# Patient Record
Sex: Male | Born: 1985
Health system: Southern US, Community
[De-identification: ages and names within clinical notes are randomized; demographics above are authoritative.]

## PROBLEM LIST (undated history)

## (undated) DIAGNOSIS — S0101XA Laceration without foreign body of scalp, initial encounter: Secondary | ICD-10-CM

## (undated) DIAGNOSIS — Z8719 Personal history of other diseases of the digestive system: Secondary | ICD-10-CM

## (undated) DIAGNOSIS — K759 Inflammatory liver disease, unspecified: Secondary | ICD-10-CM

## (undated) DIAGNOSIS — T07XXXA Unspecified multiple injuries, initial encounter: Secondary | ICD-10-CM

## (undated) DIAGNOSIS — T4145XA Adverse effect of unspecified anesthetic, initial encounter: Secondary | ICD-10-CM

## (undated) DIAGNOSIS — Z87820 Personal history of traumatic brain injury: Secondary | ICD-10-CM

## (undated) DIAGNOSIS — F988 Other specified behavioral and emotional disorders with onset usually occurring in childhood and adolescence: Secondary | ICD-10-CM

## (undated) DIAGNOSIS — T8859XA Other complications of anesthesia, initial encounter: Secondary | ICD-10-CM

## (undated) DIAGNOSIS — M1990 Unspecified osteoarthritis, unspecified site: Secondary | ICD-10-CM

## (undated) DIAGNOSIS — S022XXA Fracture of nasal bones, initial encounter for closed fracture: Secondary | ICD-10-CM

## (undated) HISTORY — PX: DENTAL SURGERY: SHX609

## (undated) HISTORY — PX: LIVER BIOPSY: SHX301

---
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
## 2006-02-19 ENCOUNTER — Emergency Department (HOSPITAL_COMMUNITY): Admission: EM | Admit: 2006-02-19 | Discharge: 2006-02-19 | Payer: Self-pay | Admitting: Family Medicine

## 2007-08-27 ENCOUNTER — Emergency Department (HOSPITAL_COMMUNITY): Admission: EM | Admit: 2007-08-27 | Discharge: 2007-08-27 | Payer: Self-pay | Admitting: Emergency Medicine

## 2008-11-20 ENCOUNTER — Emergency Department (HOSPITAL_COMMUNITY): Admission: EM | Admit: 2008-11-20 | Discharge: 2008-11-20 | Payer: Self-pay | Admitting: Emergency Medicine

## 2009-02-02 ENCOUNTER — Emergency Department (HOSPITAL_COMMUNITY): Admission: EM | Admit: 2009-02-02 | Discharge: 2009-02-02 | Payer: Self-pay | Admitting: Family Medicine

## 2010-01-12 ENCOUNTER — Emergency Department (HOSPITAL_COMMUNITY): Admission: EM | Admit: 2010-01-12 | Discharge: 2010-01-12 | Payer: Self-pay | Admitting: Emergency Medicine

## 2010-01-17 ENCOUNTER — Ambulatory Visit (HOSPITAL_BASED_OUTPATIENT_CLINIC_OR_DEPARTMENT_OTHER): Admission: RE | Admit: 2010-01-17 | Discharge: 2010-01-17 | Payer: Self-pay | Admitting: Orthopedic Surgery

## 2010-01-17 HISTORY — PX: FINGER SURGERY: SHX640

## 2010-04-25 ENCOUNTER — Emergency Department (HOSPITAL_COMMUNITY): Admission: EM | Admit: 2010-04-25 | Discharge: 2010-04-25 | Payer: Self-pay | Admitting: Emergency Medicine

## 2011-01-28 LAB — HEPATIC FUNCTION PANEL
Albumin: 4.4 g/dL (ref 3.5–5.2)
Alkaline Phosphatase: 49 U/L (ref 39–117)
Bilirubin, Direct: <0.1 mg/dL (ref 0.0–0.3)
Total Bilirubin: 0.7 mg/dL (ref 0.3–1.2)

## 2011-01-28 LAB — BASIC METABOLIC PANEL
CO2: 30 mEq/L (ref 19–32)
Calcium: 9.1 mg/dL (ref 8.4–10.5)
Chloride: 103 mEq/L (ref 96–112)
Creatinine, Ser: 0.79 mg/dL (ref 0.4–1.5)
GFR calc Af Amer: >60 mL/min (ref >60)
Glucose, Bld: 91 mg/dL (ref 70–99)

## 2011-01-28 LAB — POCT HEMOGLOBIN-HEMACUE: Hemoglobin: 17.1 g/dL — ABNORMAL HIGH (ref 13.0–17.0)

## 2012-03-09 ENCOUNTER — Emergency Department (HOSPITAL_COMMUNITY): Payer: No Typology Code available for payment source

## 2012-03-09 ENCOUNTER — Emergency Department (HOSPITAL_COMMUNITY)
Admission: EM | Admit: 2012-03-09 | Discharge: 2012-03-09 | Disposition: A | Discharge status: Home / Self Care | Payer: No Typology Code available for payment source | Attending: Emergency Medicine | Admitting: Emergency Medicine

## 2012-03-09 ENCOUNTER — Encounter (HOSPITAL_COMMUNITY): Payer: Self-pay

## 2012-03-09 DIAGNOSIS — R1012 Left upper quadrant pain: Secondary | ICD-10-CM | POA: Insufficient documentation

## 2012-03-09 DIAGNOSIS — T07XXXA Unspecified multiple injuries, initial encounter: Secondary | ICD-10-CM

## 2012-03-09 DIAGNOSIS — R51 Headache: Secondary | ICD-10-CM | POA: Insufficient documentation

## 2012-03-09 DIAGNOSIS — S022XXA Fracture of nasal bones, initial encounter for closed fracture: Secondary | ICD-10-CM

## 2012-03-09 DIAGNOSIS — S0101XA Laceration without foreign body of scalp, initial encounter: Secondary | ICD-10-CM

## 2012-03-09 DIAGNOSIS — R079 Chest pain, unspecified: Secondary | ICD-10-CM | POA: Insufficient documentation

## 2012-03-09 DIAGNOSIS — R040 Epistaxis: Secondary | ICD-10-CM | POA: Insufficient documentation

## 2012-03-09 DIAGNOSIS — S0100XA Unspecified open wound of scalp, initial encounter: Secondary | ICD-10-CM | POA: Insufficient documentation

## 2012-03-09 HISTORY — DX: Fracture of nasal bones, initial encounter for closed fracture: S02.2XXA

## 2012-03-09 HISTORY — DX: Unspecified multiple injuries, initial encounter: T07.XXXA

## 2012-03-09 HISTORY — DX: Laceration without foreign body of scalp, initial encounter: S01.01XA

## 2012-03-09 LAB — DIFFERENTIAL
Eosinophils Absolute: 0.1 10*3/uL (ref 0.0–0.7)
Lymphocytes Relative: 34 % (ref 12–46)
Lymphs Abs: 3 10*3/uL (ref 0.7–4.0)
Monocytes Relative: 4 % (ref 3–12)
Neutro Abs: 5.3 10*3/uL (ref 1.7–7.7)
Neutrophils Relative %: 61 % (ref 43–77)

## 2012-03-09 LAB — BASIC METABOLIC PANEL
BUN: 14 mg/dL (ref 6–23)
CO2: 23 mEq/L (ref 19–32)
Chloride: 104 mEq/L (ref 96–112)
Glucose, Bld: 117 mg/dL — ABNORMAL HIGH (ref 70–99)
Potassium: 3.4 mEq/L — ABNORMAL LOW (ref 3.5–5.1)

## 2012-03-09 LAB — CBC
Hemoglobin: 16.4 g/dL (ref 13.0–17.0)
MCH: 29.8 pg (ref 26.0–34.0)

## 2012-03-09 MED ORDER — FENTANYL CITRATE 0.05 MG/ML IJ SOLN
100.0000 ug | INTRAMUSCULAR | Status: DC | PRN
  Filled 2012-03-09: qty 2

## 2012-03-09 MED ORDER — OXYCODONE-ACETAMINOPHEN 5-325 MG PO TABS
1.0000 | ORAL_TABLET | Freq: Once | ORAL | Status: AC
  Administered 2012-03-09: 1 via ORAL

## 2012-03-09 MED ORDER — TETANUS-DIPHTH-ACELL PERTUSSIS 5-2.5-18.5 LF-MCG/0.5 IM SUSP
0.5000 mL | Freq: Once | INTRAMUSCULAR | Status: AC
  Administered 2012-03-09: 0.5 mL via INTRAMUSCULAR
  Filled 2012-03-09: qty 0.5

## 2012-03-09 MED ORDER — MORPHINE SULFATE 4 MG/ML IJ SOLN
4.0000 mg | Freq: Once | INTRAMUSCULAR | Status: AC
  Administered 2012-03-09: 4 mg via INTRAVENOUS
  Filled 2012-03-09: qty 1

## 2012-03-09 MED ORDER — FENTANYL CITRATE 0.05 MG/ML IJ SOLN
100.0000 ug | Freq: Once | INTRAMUSCULAR | Status: AC
  Administered 2012-03-09: 100 ug via INTRAVENOUS

## 2012-03-09 MED ORDER — IOHEXOL 300 MG/ML  SOLN
100.0000 mL | Freq: Once | INTRAMUSCULAR | Status: AC | PRN
  Administered 2012-03-09: 100 mL via INTRAVENOUS

## 2012-03-09 MED ORDER — OXYCODONE-ACETAMINOPHEN 5-325 MG PO TABS
ORAL_TABLET | ORAL | Status: AC
  Filled 2012-03-09: qty 1

## 2012-03-09 MED ORDER — FENTANYL CITRATE 0.05 MG/ML IJ SOLN
INTRAMUSCULAR | Status: AC
  Administered 2012-03-09: 100 ug
  Filled 2012-03-09: qty 2

## 2012-03-09 MED ORDER — LIDOCAINE-EPINEPHRINE-TETRACAINE (LET) SOLUTION
3.0000 mL | Freq: Once | NASAL | Status: AC
  Administered 2012-03-09: 3 mL via TOPICAL
  Filled 2012-03-09: qty 3

## 2012-03-09 MED ORDER — SODIUM CHLORIDE 0.9 % IV BOLUS (SEPSIS)
1000.0000 mL | Freq: Once | INTRAVENOUS | Status: AC
  Administered 2012-03-09: 1000 mL via INTRAVENOUS
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
## 2012-03-09 NOTE — ED Notes (Signed)
Pt returned from radiology, given suction for oral secretions. Family now at bedside

## 2012-03-09 NOTE — ED Notes (Signed)
Wet saline gauze placed over face to assist with cleaning
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
## 2012-03-09 NOTE — ED Notes (Signed)
Pt ambulated to restroom. 

## 2012-03-09 NOTE — ED Notes (Signed)
See trauma narrator 

## 2012-03-09 NOTE — ED Notes (Signed)
Pt cleaned of some dried blood, will give rest due to pain and attempt more

## 2012-03-11 ENCOUNTER — Encounter (HOSPITAL_BASED_OUTPATIENT_CLINIC_OR_DEPARTMENT_OTHER): Payer: Self-pay | Admitting: *Deleted

## 2012-03-11 NOTE — Pre-Procedure Instructions (Signed)
To come for CMET 

## 2012-03-12 ENCOUNTER — Encounter (HOSPITAL_BASED_OUTPATIENT_CLINIC_OR_DEPARTMENT_OTHER)
Admission: RE | Admit: 2012-03-12 | Discharge: 2012-03-12 | Disposition: A | Discharge status: Home / Self Care | Payer: No Typology Code available for payment source | Source: Ambulatory Visit | Attending: Otolaryngology | Admitting: Otolaryngology

## 2012-03-12 LAB — COMPREHENSIVE METABOLIC PANEL
ALT: 37 U/L (ref 0–53)
Alkaline Phosphatase: 43 U/L (ref 39–117)
CO2: 27 mEq/L (ref 19–32)
Calcium: 9.4 mg/dL (ref 8.4–10.5)
GFR calc Af Amer: >90 mL/min (ref >90)
GFR calc non Af Amer: >90 mL/min (ref >90)
Glucose, Bld: 98 mg/dL (ref 70–99)
Sodium: 140 mEq/L (ref 135–145)
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
## 2012-03-13 ENCOUNTER — Encounter (HOSPITAL_BASED_OUTPATIENT_CLINIC_OR_DEPARTMENT_OTHER): Payer: Self-pay | Admitting: Anesthesiology

## 2012-03-13 ENCOUNTER — Ambulatory Visit (HOSPITAL_BASED_OUTPATIENT_CLINIC_OR_DEPARTMENT_OTHER): Payer: Self-pay | Admitting: Anesthesiology

## 2012-03-13 ENCOUNTER — Encounter (HOSPITAL_BASED_OUTPATIENT_CLINIC_OR_DEPARTMENT_OTHER)
Admission: RE | Disposition: A | Discharge status: Home / Self Care | Payer: Self-pay | Source: Ambulatory Visit | Attending: Otolaryngology

## 2012-03-13 ENCOUNTER — Ambulatory Visit (HOSPITAL_BASED_OUTPATIENT_CLINIC_OR_DEPARTMENT_OTHER)
Admission: RE | Admit: 2012-03-13 | Discharge: 2012-03-13 | Disposition: A | Discharge status: Home / Self Care | Payer: Self-pay | Source: Ambulatory Visit | Attending: Otolaryngology | Admitting: Otolaryngology

## 2012-03-13 DIAGNOSIS — B1920 Unspecified viral hepatitis C without hepatic coma: Secondary | ICD-10-CM | POA: Insufficient documentation

## 2012-03-13 DIAGNOSIS — F17200 Nicotine dependence, unspecified, uncomplicated: Secondary | ICD-10-CM | POA: Insufficient documentation

## 2012-03-13 DIAGNOSIS — J45909 Unspecified asthma, uncomplicated: Secondary | ICD-10-CM | POA: Insufficient documentation

## 2012-03-13 DIAGNOSIS — S022XXA Fracture of nasal bones, initial encounter for closed fracture: Secondary | ICD-10-CM | POA: Insufficient documentation

## 2012-03-13 DIAGNOSIS — F988 Other specified behavioral and emotional disorders with onset usually occurring in childhood and adolescence: Secondary | ICD-10-CM | POA: Insufficient documentation

## 2012-03-13 DIAGNOSIS — M129 Arthropathy, unspecified: Secondary | ICD-10-CM | POA: Insufficient documentation

## 2012-03-13 HISTORY — DX: Adverse effect of unspecified anesthetic, initial encounter: T41.45XA

## 2012-03-13 HISTORY — PX: CLOSED REDUCTION NASAL FRACTURE: SHX5365

## 2012-03-13 HISTORY — DX: Unspecified osteoarthritis, unspecified site: M19.90

## 2012-03-13 HISTORY — DX: Inflammatory liver disease, unspecified: K75.9

## 2012-03-13 HISTORY — DX: Laceration without foreign body of scalp, initial encounter: S01.01XA

## 2012-03-13 HISTORY — DX: Other specified behavioral and emotional disorders with onset usually occurring in childhood and adolescence: F98.8

## 2012-03-13 HISTORY — DX: Personal history of other diseases of the digestive system: Z87.19

## 2012-03-13 HISTORY — DX: Personal history of traumatic brain injury: Z87.820

## 2012-03-13 HISTORY — DX: Other complications of anesthesia, initial encounter: T88.59XA

## 2012-03-13 HISTORY — DX: Fracture of nasal bones, initial encounter for closed fracture: S02.2XXA

## 2012-03-13 HISTORY — DX: Unspecified multiple injuries, initial encounter: T07.XXXA

## 2012-03-13 SURGERY — CLOSED REDUCTION, FRACTURE, NASAL BONE
Anesthesia: General | Site: Nose | Wound class: Clean Contaminated

## 2012-03-13 MED ORDER — LACTATED RINGERS IV SOLN
INTRAVENOUS | Status: DC
  Administered 2012-03-13 (×2): via INTRAVENOUS

## 2012-03-13 MED ORDER — BACITRACIN ZINC 500 UNIT/GM EX OINT
TOPICAL_OINTMENT | CUTANEOUS | Status: DC | PRN
  Administered 2012-03-13: 1 via TOPICAL

## 2012-03-13 MED ORDER — DEXAMETHASONE SODIUM PHOSPHATE 4 MG/ML IJ SOLN
INTRAMUSCULAR | Status: DC | PRN
  Administered 2012-03-13: 10 mg via INTRAVENOUS

## 2012-03-13 MED ORDER — OXYMETAZOLINE HCL 0.05 % NA SOLN
NASAL | Status: DC | PRN
  Administered 2012-03-13: 1 via NASAL

## 2012-03-13 MED ORDER — HYDROMORPHONE HCL PF 1 MG/ML IJ SOLN
0.2500 mg | INTRAMUSCULAR | Status: DC | PRN
  Administered 2012-03-13 (×4): 0.5 mg via INTRAVENOUS

## 2012-03-13 MED ORDER — OXYCODONE-ACETAMINOPHEN 5-325 MG PO TABS
2.0000 | ORAL_TABLET | Freq: Once | ORAL | Status: AC
  Administered 2012-03-13: 2 via ORAL

## 2012-03-13 MED ORDER — ONDANSETRON HCL 4 MG/2ML IJ SOLN
INTRAMUSCULAR | Status: DC | PRN
  Administered 2012-03-13 (×2): 4 mg via INTRAVENOUS

## 2012-03-13 MED ORDER — SUCCINYLCHOLINE CHLORIDE 20 MG/ML IJ SOLN
INTRAMUSCULAR | Status: DC | PRN
  Administered 2012-03-13: 100 mg via INTRAVENOUS

## 2012-03-13 MED ORDER — MIDAZOLAM HCL 5 MG/5ML IJ SOLN
INTRAMUSCULAR | Status: DC | PRN
  Administered 2012-03-13 (×3): 1 mg via INTRAVENOUS

## 2012-03-13 MED ORDER — LIDOCAINE HCL (CARDIAC) 20 MG/ML IV SOLN
INTRAVENOUS | Status: DC | PRN
  Administered 2012-03-13: 75 mg via INTRAVENOUS

## 2012-03-13 MED ORDER — OXYCODONE-ACETAMINOPHEN 10-325 MG PO TABS: 1.0000 | ORAL_TABLET | ORAL | Status: AC | PRN

## 2012-03-13 MED ORDER — PROPOFOL 10 MG/ML IV EMUL
INTRAVENOUS | Status: DC | PRN
  Administered 2012-03-13: 200 mg via INTRAVENOUS

## 2012-03-13 MED ORDER — FENTANYL CITRATE 0.05 MG/ML IJ SOLN
INTRAMUSCULAR | Status: DC | PRN
  Administered 2012-03-13 (×2): 50 ug via INTRAVENOUS

## 2012-03-13 MED ORDER — ONDANSETRON HCL 4 MG/2ML IJ SOLN: 4.0000 mg | Freq: Four times a day (QID) | INTRAMUSCULAR | Status: DC | PRN

## 2012-03-13 SURGICAL SUPPLY — 24 items
APL SKNCLS STERI-STRIP NONHPOA (GAUZE/BANDAGES/DRESSINGS) ×1
BENZOIN TINCTURE PRP APPL 2/3 (GAUZE/BANDAGES/DRESSINGS) ×2 IMPLANT
CANISTER SUCTION 1200CC (MISCELLANEOUS) ×2 IMPLANT
CLOTH BEACON ORANGE TIMEOUT ST (SAFETY) ×2 IMPLANT
CONT SPEC 4OZ CLIKSEAL STRL BL (MISCELLANEOUS) IMPLANT
DEPRESSOR TONGUE BLADE STERILE (MISCELLANEOUS) ×2 IMPLANT
DRSG TELFA 3X8 NADH (GAUZE/BANDAGES/DRESSINGS) IMPLANT
GAUZE SPONGE 4X4 12PLY STRL LF (GAUZE/BANDAGES/DRESSINGS) ×3 IMPLANT
GAUZE VASELINE FOILPK 1/2 X 72 (GAUZE/BANDAGES/DRESSINGS) ×1 IMPLANT
GLOVE BIO SURGEON STRL SZ7 (GLOVE) ×1 IMPLANT
GLOVE SS BIOGEL STRL SZ 7.5 (GLOVE) ×1 IMPLANT
GLOVE SUPERSENSE BIOGEL SZ 7.5 (GLOVE) ×1
MARKER SKIN DUAL TIP RULER LAB (MISCELLANEOUS) IMPLANT
NEEDLE 27GAX1X1/2 (NEEDLE) IMPLANT
PAD DRESSING TELFA 3X8 NADH (GAUZE/BANDAGES/DRESSINGS) IMPLANT
PATTIES SURGICAL .5 X3 (DISPOSABLE) ×2 IMPLANT
SHEET MEDIUM DRAPE 40X70 STRL (DRAPES) ×2 IMPLANT
SPLINT NASAL THERMO PLAST (MISCELLANEOUS) ×2 IMPLANT
STRIP CLOSURE SKIN 1/2X4 (GAUZE/BANDAGES/DRESSINGS) ×2 IMPLANT
SUT SILK 2 0 FS (SUTURE) ×1 IMPLANT
SYR CONTROL 10ML LL (SYRINGE) IMPLANT
TOWEL OR 17X24 6PK STRL BLUE (TOWEL DISPOSABLE) ×2 IMPLANT
TUBE CONNECTING 20X1/4 (TUBING) ×2 IMPLANT
YANKAUER SUCT BULB TIP NO VENT (SUCTIONS) IMPLANT

## 2012-03-13 NOTE — Anesthesia Preprocedure Evaluation (Signed)
Anesthesia Evaluation  Patient identified by MRN, date of birth, ID band Patient awake    Reviewed: Allergy & Precautions, H&P , NPO status , Patient's Chart, lab work & pertinent test results  Airway Mallampati: II  Neck ROM: full    Dental   Pulmonary asthma , Current Smoker,          Cardiovascular     Neuro/Psych PSYCHIATRIC DISORDERS ADD   GI/Hepatic (+) Hepatitis -, C  Endo/Other    Renal/GU      Musculoskeletal  (+) Arthritis -,   Abdominal   Peds  Hematology   Anesthesia Other Findings   Reproductive/Obstetrics                           Anesthesia Physical Anesthesia Plan  ASA: II  Anesthesia Plan: General   Post-op Pain Management:    Induction: Intravenous  Airway Management Planned: LMA  Additional Equipment:   Intra-op Plan:   Post-operative Plan: Extubation in OR  Informed Consent: I have reviewed the patients History and Physical, chart, labs and discussed the procedure including the risks, benefits and alternatives for the proposed anesthesia with the patient or authorized representative who has indicated his/her understanding and acceptance.     Plan Discussed with: CRNA and Surgeon  Anesthesia Plan Comments:         Anesthesia Quick Evaluation

## 2012-03-13 NOTE — Interval H&P Note (Signed)
History and Physical Interval Note:  03/13/2012 7:35 AM  [place] A Lindstrom  has presented today for surgery, with the diagnosis of NASAL FX  The various methods of treatment have been discussed with the patient and family. After consideration of risks, benefits and other options for treatment, the patient has consented to  Procedure(s) (LRB): CLOSED REDUCTION NASAL FRACTURE (N/A) as a surgical intervention .  The patients' history has been reviewed, patient examined, no change in status, stable for surgery.  I have reviewed the patients' chart and labs.  Questions were answered to the patient's satisfaction.     Caven Perine

## 2012-03-13 NOTE — Transfer of Care (Signed)
Immediate Anesthesia Transfer of Care Note  Patient: Bryce Morris  Procedure(s) Performed: Procedure(s) (LRB): CLOSED REDUCTION NASAL FRACTURE (N/A)  Patient Location: PACU  Anesthesia Type: General  Level of Consciousness: awake, alert  and oriented  Airway & Oxygen Therapy: Patient Spontanous Breathing and Patient connected to face mask oxygen  Post-op Assessment: Report given to PACU RN and Post -op Vital signs reviewed and stable  Post vital signs: Reviewed and stable  Complications: No apparent anesthesia complications

## 2012-03-13 NOTE — Anesthesia Postprocedure Evaluation (Signed)
Anesthesia Post Note  Patient: Bryce Morris  Procedure(s) Performed: Procedure(s) (LRB): CLOSED REDUCTION NASAL FRACTURE (N/A)  Anesthesia type: General  Patient location: PACU  Post pain: Pain level controlled and Adequate analgesia  Post assessment: Post-op Vital signs reviewed, Patient's Cardiovascular Status Stable, Respiratory Function Stable, Patent Airway and Pain level controlled  Last Vitals:  Filed Vitals:   03/13/12 0849  BP:   Pulse: 87  Temp: 36.6 C  Resp: 18    Post vital signs: Reviewed and stable  Level of consciousness: awake, alert  and oriented  Complications: No apparent anesthesia complications

## 2012-03-13 NOTE — Discharge Instructions (Signed)
Can apply cool compress to nose and face to reduce swelling. Tylenol or percocet prn pain Return to office Monday at 4:15 to have nasal packing removed   Post Anesthesia Home Care Instructions  Activity: Get plenty of rest for the remainder of the day. A responsible adult should stay with you for 24 hours following the procedure.  For the next 24 hours, DO NOT: -Drive a car -Advertising copywriter -Drink alcoholic beverages -Take any medication unless instructed by your physician -Make any legal decisions or sign important papers.  Meals: Start with liquid foods such as gelatin or soup. Progress to regular foods as tolerated. Avoid greasy, spicy, heavy foods. If nausea and/or vomiting occur, drink only clear liquids until the nausea and/or vomiting subsides. Call your physician if vomiting continues.  Special Instructions/Symptoms: Your throat may feel dry or sore from the anesthesia or the breathing tube placed in your throat during surgery. If this causes discomfort, gargle with warm salt water. The discomfort should disappear within 24 hours.

## 2012-03-13 NOTE — Anesthesia Procedure Notes (Signed)
Procedure Name: Intubation Date/Time: 03/13/2012 7:52 AM Performed by: Zenia Resides D Pre-anesthesia Checklist: Patient identified, Emergency Drugs available, Suction available, Patient being monitored and Timeout performed Patient Re-evaluated:Patient Re-evaluated prior to inductionOxygen Delivery Method: Circle System Utilized Preoxygenation: Pre-oxygenation with 100% oxygen Intubation Type: IV induction Ventilation: Mask ventilation without difficulty Laryngoscope Size: Mac and 3 Grade View: Grade II Tube type: Oral Number of attempts: 1 Airway Equipment and Method: stylet and oral airway Placement Confirmation: ETT inserted through vocal cords under direct vision,  positive ETCO2 and breath sounds checked- equal and bilateral Secured at: 23 cm Tube secured with: Tape Dental Injury: Teeth and Oropharynx as per pre-operative assessment

## 2012-03-13 NOTE — Brief Op Note (Signed)
03/13/2012  8:47 AM  PATIENT:  Randa Spike A Baysinger  26 y.o. male  PRE-OPERATIVE DIAGNOSIS:  NASAL SEPTAL FRACTURE  POST-OPERATIVE DIAGNOSIS:  NASAL SEPTAL FRACTURE  PROCEDURE:  Procedure(s) (LRB): CLOSED REDUCTION NASAL SEPTAL FRACTURE (N/A)  SURGEON:  Surgeon(s) and Role:    * Drema Halon, MD - Primary  PHYSICIAN ASSISTANT:   ASSISTANTS: none   ANESTHESIA:   general  EBL:     BLOOD ADMINISTERED:none  DRAINS: none   LOCAL MEDICATIONS USED:  NONE  SPECIMEN:  No Specimen  DISPOSITION OF SPECIMEN:  N/A  COUNTS:  YES  TOURNIQUET:  * No tourniquets in log *  DICTATION: .Other Dictation: Dictation Number [id number]  PLAN OF CARE: Discharge to home after PACU  PATIENT DISPOSITION:  PACU - hemodynamically stable.   Delay start of Pharmacological VTE agent (>24hrs) due to surgical blood loss or risk of bleeding: not applicable

## 2012-03-14 ENCOUNTER — Encounter (HOSPITAL_BASED_OUTPATIENT_CLINIC_OR_DEPARTMENT_OTHER): Payer: Self-pay | Admitting: Otolaryngology
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
## 2013-08-11 ENCOUNTER — Emergency Department: Payer: Self-pay | Admitting: Emergency Medicine

## 2013-08-11 LAB — DRUG SCREEN, URINE
Amphetamines, Ur Screen: NEGATIVE
Barbiturates, Ur Screen: NEGATIVE
Benzodiazepine, Ur Scrn: NEGATIVE
Cannabinoid 50 Ng, Ur ~~LOC~~: POSITIVE
Cocaine Metabolite,Ur ~~LOC~~: NEGATIVE
MDMA (Ecstasy)Ur Screen: NEGATIVE
Methadone, Ur Screen: POSITIVE
Opiate, Ur Screen: NEGATIVE
Phencyclidine (PCP) Ur S: NEGATIVE
Tricyclic, Ur Screen: NEGATIVE

## 2013-08-11 LAB — URINALYSIS, COMPLETE
Bacteria: NONE SEEN
Bilirubin,UR: NEGATIVE
Blood: NEGATIVE
Glucose,UR: NEGATIVE mg/dL
Ketone: NEGATIVE
Leukocyte Esterase: NEGATIVE
Nitrite: NEGATIVE
Ph: 5
Protein: NEGATIVE
RBC,UR: <1 /HPF
Specific Gravity: 1.023
Squamous Epithelial: <1
WBC UR: 1 /HPF

## 2013-08-11 LAB — COMPREHENSIVE METABOLIC PANEL
Bilirubin,Total: 0.7 mg/dL (ref 0.2–1.0)
Calcium, Total: 8.9 mg/dL (ref 8.5–10.1)
Chloride: 104 mmol/L (ref 98–107)
Creatinine: 1.21 mg/dL (ref 0.60–1.30)
EGFR (African American): >60
EGFR (Non-African Amer.): >60
Glucose: 104 mg/dL — ABNORMAL HIGH (ref 65–99)
SGPT (ALT): 75 U/L (ref 12–78)
Sodium: 137 mmol/L (ref 136–145)
Total Protein: 7.1 g/dL (ref 6.4–8.2)

## 2013-08-11 LAB — TSH: Thyroid Stimulating Horm: 1.87 u[IU]/mL

## 2013-08-11 LAB — CBC
HCT: 47.6 %
HGB: 16.8 g/dL
MCH: 29.2 pg
MCHC: 35.3 g/dL
MCV: 83 fL
Platelet: 186 x10 3/mm 3
RBC: 5.75 x10 6/mm 3
RDW: 13.1 %
WBC: 10.5 x10 3/mm 3

## 2013-08-11 LAB — ETHANOL
Ethanol %: <0.003 % (ref 0.000–0.080)
Ethanol: <3 mg/dL

## 2015-02-23 ENCOUNTER — Emergency Department (HOSPITAL_COMMUNITY)
Admission: EM | Admit: 2015-02-23 | Discharge: 2015-02-24 | Disposition: A | Discharge status: Home / Self Care | Payer: 59 | Source: Home / Self Care | Attending: Emergency Medicine | Admitting: Emergency Medicine

## 2015-02-23 ENCOUNTER — Encounter (HOSPITAL_COMMUNITY): Payer: Self-pay | Admitting: Emergency Medicine

## 2015-02-23 DIAGNOSIS — Z87828 Personal history of other (healed) physical injury and trauma: Secondary | ICD-10-CM | POA: Insufficient documentation

## 2015-02-23 DIAGNOSIS — L0231 Cutaneous abscess of buttock: Secondary | ICD-10-CM | POA: Insufficient documentation

## 2015-02-23 DIAGNOSIS — J45909 Unspecified asthma, uncomplicated: Secondary | ICD-10-CM

## 2015-02-23 DIAGNOSIS — Z8659 Personal history of other mental and behavioral disorders: Secondary | ICD-10-CM | POA: Insufficient documentation

## 2015-02-23 DIAGNOSIS — Z880 Allergy status to penicillin: Secondary | ICD-10-CM

## 2015-02-23 DIAGNOSIS — Z8619 Personal history of other infectious and parasitic diseases: Secondary | ICD-10-CM | POA: Insufficient documentation

## 2015-02-23 DIAGNOSIS — Z8669 Personal history of other diseases of the nervous system and sense organs: Secondary | ICD-10-CM

## 2015-02-23 DIAGNOSIS — Z8781 Personal history of (healed) traumatic fracture: Secondary | ICD-10-CM

## 2015-02-23 DIAGNOSIS — Z720 Tobacco use: Secondary | ICD-10-CM | POA: Insufficient documentation

## 2015-02-23 DIAGNOSIS — K6289 Other specified diseases of anus and rectum: Secondary | ICD-10-CM | POA: Diagnosis not present

## 2015-02-23 DIAGNOSIS — K611 Rectal abscess: Secondary | ICD-10-CM | POA: Diagnosis not present

## 2015-02-23 DIAGNOSIS — M1990 Unspecified osteoarthritis, unspecified site: Secondary | ICD-10-CM | POA: Diagnosis present

## 2015-02-23 DIAGNOSIS — L0291 Cutaneous abscess, unspecified: Secondary | ICD-10-CM

## 2015-02-23 DIAGNOSIS — Z79899 Other long term (current) drug therapy: Secondary | ICD-10-CM | POA: Insufficient documentation

## 2015-02-23 DIAGNOSIS — Z8739 Personal history of other diseases of the musculoskeletal system and connective tissue: Secondary | ICD-10-CM

## 2015-02-23 DIAGNOSIS — F17210 Nicotine dependence, cigarettes, uncomplicated: Secondary | ICD-10-CM | POA: Diagnosis present

## 2015-02-23 DIAGNOSIS — L03317 Cellulitis of buttock: Secondary | ICD-10-CM | POA: Diagnosis present

## 2015-02-23 NOTE — ED Notes (Signed)
Pt. reports progressing abscess at right inner buttocks with no drainage onset last week . Denies fever or chills.

## 2015-02-23 NOTE — ED Provider Notes (Signed)
CSN: [id number]     Arrival date & time 02/23/15  2344 History  This chart was scribed for Mirian MoMatthew Gentry, MD by Tanda RockersMargaux Venter, ED Scribe. This patient was seen in room D35C/D35C and the patient's care was started at 12:10 AM.    Chief Complaint  Patient presents with  . Abscess   The history is provided by the patient. No language interpreter was used.     HPI Comments: Janice NorrieForrest A Paxton is a 29 y.o. male who presents to the Emergency Department complaining of abscess to the right inner buttocks that he noticed 1 week ago, worsening 2 days ago. He admits to increased pain and tenderness to the area. There has been no drainage to the abscess. Pt denies every having abscess in the past. He denies fever, chills, nausea, vomiting, or any other symptoms. Pain worsened with movement, palpation, alleviated by rest.  Timing constant.    Past Medical History  Diagnosis Date  . Asthma childhood/teen yrs.  Marland Kitchen. Hx of concussion 2 yrs. ago  . Hepatitis     Hep. C  . History of indigestion     TUMS as needed  . ADD (attention deficit disorder)     no current med.  . Nasal bone fx-closed 03/09/2012  . Complication of anesthesia     states is hard to wake up post-op  . Arthritis     hands  . Abrasions of multiple sites 03/09/2012    scalp, arms, left ankle  . Scalp laceration 03/09/2012    staples x 2   Past Surgical History  Procedure Laterality Date  . Liver biopsy    . Dental surgery      multiple  . Finger surgery  01/17/2010    repair left index finger ulnar nerve/artery and FDP lac.  . Closed reduction nasal fracture  03/13/2012    Procedure: CLOSED REDUCTION NASAL FRACTURE;  Surgeon: Drema Halonhristopher E Newman, MD;  Location: [facility];  Service: ENT;  Laterality: N/A;  CLOSED REDUCTION NASAL SEPTAL FRACTURE    Family History  Problem Relation Age of Onset  . Anesthesia problems Mother     hard to wake up post-op   History  Substance Use Topics  . Smoking status: Current  Every Day Smoker -- 1.00 packs/day for 10 years    Types: Cigarettes  . Smokeless tobacco: Never Used  . Alcohol Use: Yes     Comment: occasionally    Review of Systems  Constitutional: Negative for fever and chills.  Gastrointestinal: Negative for nausea and vomiting.  Skin:       Positive for abscess to right inner buttock.   All other systems reviewed and are negative.     Allergies  Penicillins; Hydrocodone; and Other  Home Medications   Prior to Admission medications   Medication Sig Start Date End Date Taking? Authorizing Provider  amoxicillin-clavulanate (AUGMENTIN) 875-125 MG per tablet Take 1 tablet by mouth 2 (two) times daily.    Historical Provider, MD  ibuprofen (ADVIL,MOTRIN) 200 MG tablet Take 200 mg by mouth every 6 (six) hours as needed.    Historical Provider, MD  loratadine (CLARITIN) 10 MG tablet Take 10 mg by mouth daily.    Historical Provider, MD  omeprazole (PRILOSEC) 20 MG capsule Take 20 mg by mouth as needed.    Historical Provider, MD  oxyCODONE-acetaminophen (PERCOCET) 5-325 MG per tablet Take 1 tablet by mouth every 4 (four) hours as needed.    Historical Provider, MD  sulfamethoxazole-trimethoprim (  BACTRIM DS,SEPTRA DS) 800-160 MG per tablet Take 1 tablet by mouth 2 (two) times daily. 02/24/15   Mirian Mo, MD   Triage Vitals: BP 114/71 mmHg  Pulse 86  Temp(Src) 97.8 F (36.6 C) (Oral)  Resp 16  Ht  (1.778 m)  Wt 231 lb 9.6 oz (105.053 kg)  BMI 33.23 kg/m2  SpO2 98%   Physical Exam  Constitutional: He is oriented to person, place, and time. He appears well-developed and well-nourished.  HENT:  Head: Normocephalic and atraumatic.  Eyes: Conjunctivae and EOM are normal.  Neck: Normal range of motion. Neck supple.  Cardiovascular: Normal rate, regular rhythm and normal heart sounds.   Pulmonary/Chest: Effort normal and breath sounds normal. No respiratory distress.  Abdominal: He exhibits no distension. There is no tenderness.  There is no rebound and no guarding.  Musculoskeletal: Normal range of motion.  Neurological: He is alert and oriented to person, place, and time.  Skin: Skin is warm and dry. Rash noted.  Abscess to R glut 4 cm fluctuant with 8 cm surrounding erythema  Vitals reviewed.   ED Course  INCISION AND DRAINAGE Date/Time: 02/24/2015 1:04 AM Performed by: Mirian Mo Authorized by: Mirian Mo Consent: Verbal consent obtained. Type: abscess Body area: lower extremity Location details: right buttock Anesthesia: local infiltration Local anesthetic: lidocaine 1% with epinephrine Anesthetic total: 15 ml Patient sedated: no Scalpel size: 11 Incision type: single straight Complexity: complex Drainage: purulent Drainage amount: moderate Wound treatment: wound left open Packing material: 1/4 in iodoform gauze Patient tolerance: Patient tolerated the procedure well with no immediate complications   (including critical care time)  DIAGNOSTIC STUDIES: Oxygen Saturation is 98% on RA, normal by my interpretation.    COORDINATION OF CARE: 12:13 AM-Discussed treatment plan which includes I&D with pt at bedside and pt agreed to plan.   Labs Review Labs Reviewed - No data to display  Imaging Review No results found.   EKG Interpretation None      MDM   Final diagnoses:  Abscess    29 y.o. male with pertinent PMH of Hep C presents with abscess to R buttock.  No systemic symptoms.  Drained uneventfully.  DC home with standard return precautions.    I have reviewed all laboratory and imaging studies if ordered as above  1. Abscess           Mirian Mo, MD 02/24/15 [phone]

## 2015-02-24 ENCOUNTER — Encounter (HOSPITAL_COMMUNITY): Payer: Self-pay | Admitting: *Deleted

## 2015-02-24 ENCOUNTER — Inpatient Hospital Stay (HOSPITAL_COMMUNITY)
Admission: EM | Admit: 2015-02-24 | Discharge: 2015-02-28 | DRG: 394 | Disposition: A | Discharge status: Home / Self Care | Payer: 59 | Attending: Surgery | Admitting: Surgery

## 2015-02-24 DIAGNOSIS — L0231 Cutaneous abscess of buttock: Secondary | ICD-10-CM | POA: Diagnosis present

## 2015-02-24 DIAGNOSIS — L03317 Cellulitis of buttock: Secondary | ICD-10-CM | POA: Diagnosis present

## 2015-02-24 DIAGNOSIS — K611 Rectal abscess: Secondary | ICD-10-CM

## 2015-02-24 DIAGNOSIS — Z880 Allergy status to penicillin: Secondary | ICD-10-CM | POA: Diagnosis not present

## 2015-02-24 DIAGNOSIS — J45909 Unspecified asthma, uncomplicated: Secondary | ICD-10-CM | POA: Diagnosis present

## 2015-02-24 DIAGNOSIS — K6289 Other specified diseases of anus and rectum: Secondary | ICD-10-CM | POA: Diagnosis present

## 2015-02-24 DIAGNOSIS — F17210 Nicotine dependence, cigarettes, uncomplicated: Secondary | ICD-10-CM | POA: Diagnosis present

## 2015-02-24 DIAGNOSIS — M1990 Unspecified osteoarthritis, unspecified site: Secondary | ICD-10-CM | POA: Diagnosis present

## 2015-02-24 LAB — COMPREHENSIVE METABOLIC PANEL
ALBUMIN: 4.3 g/dL (ref 3.5–5.2)
ALK PHOS: 51 U/L (ref 39–117)
ALT: 33 U/L (ref 0–53)
AST: 22 U/L (ref 0–37)
Anion gap: 9 (ref 5–15)
BUN: 18 mg/dL (ref 6–23)
CO2: 22 mmol/L (ref 19–32)
Calcium: 9.3 mg/dL (ref 8.4–10.5)
Chloride: 107 mmol/L (ref 96–112)
Creatinine, Ser: 1.19 mg/dL (ref 0.50–1.35)
GFR calc Af Amer: >90 mL/min (ref >90)
GFR calc non Af Amer: 81 mL/min — ABNORMAL LOW (ref >90)
Glucose, Bld: 115 mg/dL — ABNORMAL HIGH (ref 70–99)
Potassium: 4.2 mmol/L (ref 3.5–5.1)
Sodium: 138 mmol/L (ref 135–145)
TOTAL PROTEIN: 7.1 g/dL (ref 6.0–8.3)
Total Bilirubin: 0.6 mg/dL (ref 0.3–1.2)

## 2015-02-24 LAB — URINALYSIS, ROUTINE W REFLEX MICROSCOPIC
Bilirubin Urine: NEGATIVE
GLUCOSE, UA: NEGATIVE mg/dL
Hgb urine dipstick: NEGATIVE
Ketones, ur: NEGATIVE mg/dL
LEUKOCYTES UA: NEGATIVE
NITRITE: NEGATIVE
PROTEIN: NEGATIVE mg/dL
Specific Gravity, Urine: 1.025 (ref 1.005–1.030)
UROBILINOGEN UA: 0.2 mg/dL (ref 0.0–1.0)
pH: 5.5 (ref 5.0–8.0)

## 2015-02-24 LAB — CBC WITH DIFFERENTIAL/PLATELET
Basophils Absolute: 0 10*3/uL (ref 0.0–0.1)
Basophils Relative: 0 % (ref 0–1)
EOS ABS: 0.2 10*3/uL (ref 0.0–0.7)
Eosinophils Relative: 2 % (ref 0–5)
HCT: 45.2 % (ref 39.0–52.0)
HEMOGLOBIN: 16.1 g/dL (ref 13.0–17.0)
LYMPHS ABS: 1.3 10*3/uL (ref 0.7–4.0)
LYMPHS PCT: 10 % — AB (ref 12–46)
MCH: 29.5 pg (ref 26.0–34.0)
MCHC: 35.6 g/dL (ref 30.0–36.0)
MCV: 82.8 fL (ref 78.0–100.0)
MONOS PCT: 6 % (ref 3–12)
Monocytes Absolute: 0.7 10*3/uL (ref 0.1–1.0)
NEUTROS PCT: 82 % — AB (ref 43–77)
Neutro Abs: 10.6 10*3/uL — ABNORMAL HIGH (ref 1.7–7.7)
Platelets: 192 10*3/uL (ref 150–400)
RBC: 5.46 MIL/uL (ref 4.22–5.81)
RDW: 13.4 % (ref 11.5–15.5)
WBC: 12.8 10*3/uL — AB (ref 4.0–10.5)

## 2015-02-24 LAB — I-STAT CG4 LACTIC ACID, ED: LACTIC ACID, VENOUS: 0.75 mmol/L (ref 0.5–2.0)

## 2015-02-24 MED ORDER — CIPROFLOXACIN IN D5W 400 MG/200ML IV SOLN
400.0000 mg | Freq: Two times a day (BID) | INTRAVENOUS | Status: DC
  Administered 2015-02-25 – 2015-02-28 (×7): 400 mg via INTRAVENOUS
  Filled 2015-02-24 (×9): qty 200

## 2015-02-24 MED ORDER — METRONIDAZOLE IN NACL 5-0.79 MG/ML-% IV SOLN
500.0000 mg | Freq: Three times a day (TID) | INTRAVENOUS | Status: DC
  Administered 2015-02-25 – 2015-02-28 (×11): 500 mg via INTRAVENOUS
  Filled 2015-02-24 (×13): qty 100

## 2015-02-24 MED ORDER — ONDANSETRON HCL 4 MG PO TABS: 4.0000 mg | ORAL_TABLET | Freq: Once | ORAL | Status: DC

## 2015-02-24 MED ORDER — PANTOPRAZOLE SODIUM 40 MG IV SOLR
40.0000 mg | Freq: Every day | INTRAVENOUS | Status: DC
  Administered 2015-02-25: 40 mg via INTRAVENOUS
  Filled 2015-02-24 (×2): qty 40

## 2015-02-24 MED ORDER — OXYCODONE HCL 5 MG PO TABS
5.0000 mg | ORAL_TABLET | ORAL | Status: DC | PRN
  Administered 2015-02-25 (×2): 5 mg via ORAL
  Filled 2015-02-24 (×2): qty 1

## 2015-02-24 MED ORDER — ONDANSETRON 4 MG PO TBDP
ORAL_TABLET | ORAL | Status: AC
  Filled 2015-02-24: qty 1

## 2015-02-24 MED ORDER — IBUPROFEN 200 MG PO TABS
400.0000 mg | ORAL_TABLET | Freq: Three times a day (TID) | ORAL | Status: DC | PRN
  Administered 2015-02-25 – 2015-02-28 (×7): 400 mg via ORAL
  Filled 2015-02-24 (×7): qty 2

## 2015-02-24 MED ORDER — SULFAMETHOXAZOLE-TRIMETHOPRIM 800-160 MG PO TABS: 1.0000 | ORAL_TABLET | Freq: Two times a day (BID) | ORAL | Status: DC

## 2015-02-24 MED ORDER — KCL IN DEXTROSE-NACL 20-5-0.45 MEQ/L-%-% IV SOLN
INTRAVENOUS | Status: DC
  Administered 2015-02-25 – 2015-02-27 (×4): via INTRAVENOUS
  Filled 2015-02-24 (×7): qty 1000

## 2015-02-24 MED ORDER — MORPHINE SULFATE 4 MG/ML IJ SOLN
4.0000 mg | INTRAMUSCULAR | Status: DC | PRN
  Administered 2015-02-24 – 2015-02-25 (×2): 4 mg via INTRAVENOUS
  Filled 2015-02-24 (×2): qty 1

## 2015-02-24 MED ORDER — MORPHINE SULFATE 2 MG/ML IJ SOLN
2.0000 mg | INTRAMUSCULAR | Status: DC | PRN
  Administered 2015-02-25 (×3): 4 mg via INTRAVENOUS
  Administered 2015-02-25: 2 mg via INTRAVENOUS
  Administered 2015-02-25: 4 mg via INTRAVENOUS
  Administered 2015-02-25 – 2015-02-26 (×2): 2 mg via INTRAVENOUS
  Filled 2015-02-24 (×2): qty 2
  Filled 2015-02-24: qty 1
  Filled 2015-02-24: qty 2
  Filled 2015-02-24: qty 1
  Filled 2015-02-24: qty 2
  Filled 2015-02-24: qty 1

## 2015-02-24 MED ORDER — ONDANSETRON 4 MG PO TBDP
4.0000 mg | ORAL_TABLET | Freq: Once | ORAL | Status: AC
  Administered 2015-02-24: 4 mg via ORAL

## 2015-02-24 MED ORDER — SODIUM CHLORIDE 0.9 % IV BOLUS (SEPSIS)
1000.0000 mL | Freq: Once | INTRAVENOUS | Status: AC
  Administered 2015-02-24: 1000 mL via INTRAVENOUS

## 2015-02-24 MED ORDER — ONDANSETRON HCL 4 MG/2ML IJ SOLN
4.0000 mg | Freq: Four times a day (QID) | INTRAMUSCULAR | Status: DC | PRN
Start: 2015-02-24 — End: 2015-02-28
  Administered 2015-02-26 – 2015-02-28 (×4): 4 mg via INTRAVENOUS
  Filled 2015-02-24 (×4): qty 2

## 2015-02-24 MED ORDER — ONDANSETRON HCL 4 MG/2ML IJ SOLN
4.0000 mg | Freq: Once | INTRAMUSCULAR | Status: AC
  Administered 2015-02-24: 4 mg via INTRAVENOUS
  Filled 2015-02-24: qty 2

## 2015-02-24 MED ORDER — ENOXAPARIN SODIUM 40 MG/0.4ML ~~LOC~~ SOLN
40.0000 mg | SUBCUTANEOUS | Status: DC
Start: 2015-02-25 — End: 2015-02-28
  Administered 2015-02-25 – 2015-02-28 (×4): 40 mg via SUBCUTANEOUS
  Filled 2015-02-24 (×4): qty 0.4

## 2015-02-24 MED ORDER — LIDOCAINE-EPINEPHRINE 1 %-1:100000 IJ SOLN
20.0000 mL | Freq: Once | INTRAMUSCULAR | Status: AC
  Administered 2015-02-24: 20 mL
  Filled 2015-02-24: qty 1

## 2015-02-24 NOTE — Discharge Instructions (Signed)

## 2015-02-24 NOTE — ED Notes (Signed)
Pt A&OX4, ambulatory at d/c with steady gait, NAD 

## 2015-02-24 NOTE — Progress Notes (Signed)
New Admission Note:   Arrival Method: via stretcher from ED Mental Orientation: Alert and Oriented x4 Telemetry: N/A Assessment: Completed Skin: Warm and dry. Abscess to Right Medial Buttock IV: Right AC Peripheral IV Infusing D5 1/2NS 20meq KCL @ 100 mL/hr per MD order Pain: 6/10 generalized aching pain Tubes: N/A Safety Measures: Educated on fall prevention safety plan, patient acknowledged and understood. Admission: Completed [facility] [address] Orientation: Patient has been orientated to the room, unit and staff.  Family: Mother at bedside  Orders have been reviewed and implemented. Will continue to monitor the patient. Call light has been placed within reach and bed alarm has been activated.    Billy FischerLynn Oakleigh Hesketh, RN  Phone number: [phone]

## 2015-02-24 NOTE — ED Notes (Signed)
Called for room x 1 with no answer.

## 2015-02-24 NOTE — ED Notes (Signed)
Pt states he had an abscess cut out yesterday and today got a fever of 101.7. Also c/o dizziness and nausea. States he took the tylenol 2 hrs ago.

## 2015-02-24 NOTE — Progress Notes (Signed)
ANTIBIOTIC CONSULT NOTE - INITIAL  Pharmacy Consult for vancomycin Indication: cellulitis  Allergies  Allergen Reactions  . Penicillins Hives, Shortness Of Breath and Swelling  . Tylenol [Acetaminophen] Other (See Comments)    Hep C  . Hydrocodone Nausea Only  . Other Rash    LOTIONS    Patient Measurements: Height: 5\' 10"  (177.8 cm) Weight: 231 lb 11.3 oz (105.1 kg) IBW/kg (Calculated) : 73  Vital Signs: Temp: 100.8 F (38.2 C) (04/21 2159) BP: 105/61 mmHg (04/21 2315) Pulse Rate: 95 (04/21 2315)  Labs:  Recent Labs  02/24/15 2225  WBC 12.8*  HGB 16.1  PLT 192  CREATININE 1.19   Estimated Creatinine Clearance: 111.2 mL/min (by C-G formula based on Cr of 1.19).  Medical History: Past Medical History  Diagnosis Date  . Asthma childhood/teen yrs.  Marland Kitchen. Hx of concussion 2 yrs. ago  . Hepatitis     Hep. C  . History of indigestion     TUMS as needed  . ADD (attention deficit disorder)     no current med.  . Nasal bone fx-closed 03/09/2012  . Complication of anesthesia     states is hard to wake up post-op  . Arthritis     hands  . Abrasions of multiple sites 03/09/2012    scalp, arms, left ankle  . Scalp laceration 03/09/2012    staples x 2    Medications:  Prescriptions prior to admission  Medication Sig Dispense Refill Last Dose  . aspirin EC 81 MG tablet Take 162 mg by mouth daily.   02/24/2015 at Unknown time  . ibuprofen (ADVIL,MOTRIN) 200 MG tablet Take 600 mg by mouth every 6 (six) hours as needed for mild pain.    02/24/2015 at Unknown time  . sulfamethoxazole-trimethoprim (BACTRIM DS,SEPTRA DS) 800-160 MG per tablet Take 1 tablet by mouth 2 (two) times daily. 14 tablet 0 02/24/2015 at Unknown time   Scheduled:  . [START ON 02/25/2015] ciprofloxacin  400 mg Intravenous Q12H  . [START ON 02/25/2015] enoxaparin (LOVENOX) injection  40 mg Subcutaneous Q24H  . [START ON 02/25/2015] metronidazole  500 mg Intravenous Q8H  . ondansetron      . [START ON  02/25/2015] pantoprazole (PROTONIX) IV  40 mg Intravenous QHS   Infusions:  . [START ON 02/25/2015] dextrose 5 % and 0.45 % NaCl with KCl 20 mEq/L      Assessment: 28yo male presented to ED yesterday for abscess of inner buttocks, had bedside I&D and sent home w/ Septra, returns today c/o fever, dizziness, and nausea, I&D site cultured and repacked tonight by surgeon, plan to go to OR if cellulitis does not improve, to begin IV ABX.  Goal of Therapy:  Vancomycin trough level 10-15 mcg/ml  Plan:  Will give vancomycin 2000mg  IV x1 followed by 1000mg  IV Q8H and monitor CBC, Cx, levels prn.  Vernard GamblesVeronda Secundino Ellithorpe, PharmD, BCPS  02/24/2015,11:54 PM

## 2015-02-24 NOTE — H&P (Signed)
Bryce Morris is an 29 y.o. male.   Chief Complaint: R buttock abscess, fever HPI: [name] initially presented to the ED last night with a two-week history of a painful area on his right medial buttock. He was diagnosed with buttock abscess and underwent incision and drainage. He was prescribed Bactrim and discharged. At home, he continued to have pain and developed fever and chills. He returned to the emergency department tonight. I was asked to see him for evaluation of this abscess.  Past Medical History  Diagnosis Date  . Asthma childhood/teen yrs.  Marland Kitchen Hx of concussion 2 yrs. ago  . Hepatitis     Hep. C  . History of indigestion     TUMS as needed  . ADD (attention deficit disorder)     no current med.  . Nasal bone fx-closed 03/09/2012  . Complication of anesthesia     states is hard to wake up post-op  . Arthritis     hands  . Abrasions of multiple sites 03/09/2012    scalp, arms, left ankle  . Scalp laceration 03/09/2012    staples x 2    Past Surgical History  Procedure Laterality Date  . Liver biopsy    . Dental surgery      multiple  . Finger surgery  01/17/2010    repair left index finger ulnar nerve/artery and FDP lac.  . Closed reduction nasal fracture  03/13/2012    Procedure: CLOSED REDUCTION NASAL FRACTURE;  Surgeon: Drema Halon, MD;  Location: [facility];  Service: ENT;  Laterality: N/A;  CLOSED REDUCTION NASAL SEPTAL FRACTURE     Family History  Problem Relation Age of Onset  . Anesthesia problems Mother     hard to wake up post-op   Social History:  reports that he has been smoking Cigarettes.  He has a 10 pack-year smoking history. He has never used smokeless tobacco. He reports that he drinks alcohol. He reports that he uses illicit drugs (Marijuana).  Allergies:  Allergies  Allergen Reactions  . Penicillins Hives, Shortness Of Breath and Swelling  . Tylenol [Acetaminophen] Other (See Comments)    Hep C  . Hydrocodone Nausea Only    . Other Rash    LOTIONS     (Not in a hospital admission)  Results for orders placed or performed during the hospital encounter of 02/24/15 (from the past 48 hour(s))  CBC WITH DIFFERENTIAL     Status: Abnormal   Collection Time: 02/24/15 10:25 PM  Result Value Ref Range   WBC 12.8 (H) 4.0 - 10.5 K/uL   RBC 5.46 4.22 - 5.81 MIL/uL   Hemoglobin 16.1 13.0 - 17.0 g/dL   HCT 16.1 09.6 - 04.5 %   MCV 82.8 78.0 - 100.0 fL   MCH 29.5 26.0 - 34.0 pg   MCHC 35.6 30.0 - 36.0 g/dL   RDW 40.9 81.1 - 91.4 %   Platelets 192 150 - 400 K/uL   Neutrophils Relative % 82 (H) 43 - 77 %   Neutro Abs 10.6 (H) 1.7 - 7.7 K/uL   Lymphocytes Relative 10 (L) 12 - 46 %   Lymphs Abs 1.3 0.7 - 4.0 K/uL   Monocytes Relative 6 3 - 12 %   Monocytes Absolute 0.7 0.1 - 1.0 K/uL   Eosinophils Relative 2 0 - 5 %   Eosinophils Absolute 0.2 0.0 - 0.7 K/uL   Basophils Relative 0 0 - 1 %   Basophils Absolute 0.0 0.0 -  0.1 K/uL  I-Stat CG4 Lactic Acid, ED     Status: None   Collection Time: 02/24/15 10:44 PM  Result Value Ref Range   Lactic Acid, Venous 0.75 0.5 - 2.0 mmol/L   No results found.  Review of Systems  Constitutional: Positive for fever and chills.  HENT: Negative.   Eyes: Negative.   Respiratory: Negative.   Cardiovascular: Negative.   Gastrointestinal: Negative.   Genitourinary: Negative.   Musculoskeletal: Negative.   Skin:       See history of present illness  Neurological: Negative.   Endo/Heme/Allergies: Negative.   Psychiatric/Behavioral: Negative.     Blood pressure 141/66, pulse 106, temperature 100.8 F (38.2 C), resp. rate 18, SpO2 97 %. Physical Exam  Constitutional: He is oriented to person, place, and time. He appears well-developed and well-nourished. No distress.  HENT:  Head: Normocephalic.  Eyes: EOM are normal. Pupils are equal, round, and reactive to light.  Neck: Normal range of motion. No tracheal deviation present.  Cardiovascular: Normal rate and normal heart  sounds.   Respiratory: Effort normal and breath sounds normal. No stridor. No respiratory distress. He has no wheezes. He has no rales.  GI: Soft. He exhibits no distension. There is no tenderness.  Genitourinary:     10 cm area of cellulitis right medial buttock, central incision and drainage site was probed deeply, seems to enter a pocket, fluid was sent for cultures, wound was packed with quarter-inch iodoform  Neurological: He is alert and oriented to person, place, and time.  Skin: Skin is warm.  See above  Psychiatric: He has a normal mood and affect.     Assessment/Plan Right medial buttock abscess with cellulitis - incision and drainage seems adequate, wound was cultured and repacked as above. Will admit for IV antibiotics. He has a penicillin allergy so we'll begin vancomycin, Cipro, and Flagyl. If the cellulitis does not improve, he may need further incision and drainage in the operating room. I discussed this with him and his mother.  Zona Pedro E 02/24/2015, 11:20 PM

## 2015-02-24 NOTE — Progress Notes (Signed)
Received report. Awaiting for patient to transfer to floor.

## 2015-02-24 NOTE — ED Provider Notes (Signed)
CSN: [id number]     Arrival date & time 02/24/15  2141 History   First MD Initiated Contact with Patient 02/24/15 2226     Chief Complaint  Patient presents with  . Fever      HPI  Patient presents for evaluation of fever and body aches. Seen and evaluated yesterday. Had a large gluteal abscess that underwent incision and drainage. Had packing placed. He waking this morning. He states he felt "terrible" describes bodyaches weakness. Fever shakes and chills today. He returned home from work. He presents here with worsening pain weakness and fever.  Past Medical History  Diagnosis Date  . Asthma childhood/teen yrs.  Marland Kitchen. Hx of concussion 2 yrs. ago  . Hepatitis     Hep. C  . History of indigestion     TUMS as needed  . ADD (attention deficit disorder)     no current med.  . Nasal bone fx-closed 03/09/2012  . Complication of anesthesia     states is hard to wake up post-op  . Arthritis     hands  . Abrasions of multiple sites 03/09/2012    scalp, arms, left ankle  . Scalp laceration 03/09/2012    staples x 2   Past Surgical History  Procedure Laterality Date  . Liver biopsy    . Dental surgery      multiple  . Finger surgery  01/17/2010    repair left index finger ulnar nerve/artery and FDP lac.  . Closed reduction nasal fracture  03/13/2012    Procedure: CLOSED REDUCTION NASAL FRACTURE;  Surgeon: Drema Halonhristopher E Newman, MD;  Location: [facility];  Service: ENT;  Laterality: N/A;  CLOSED REDUCTION NASAL SEPTAL FRACTURE    Family History  Problem Relation Age of Onset  . Anesthesia problems Mother     hard to wake up post-op   History  Substance Use Topics  . Smoking status: Current Every Day Smoker -- 1.00 packs/day for 10 years    Types: Cigarettes  . Smokeless tobacco: Never Used  . Alcohol Use: Yes     Comment: occasionally    Review of Systems  Constitutional: Positive for fever, chills and fatigue. Negative for diaphoresis and appetite change.  HENT:  Negative for mouth sores, sore throat and trouble swallowing.   Eyes: Negative for visual disturbance.  Respiratory: Negative for cough, chest tightness, shortness of breath and wheezing.   Cardiovascular: Negative for chest pain.  Gastrointestinal: Negative for nausea, vomiting, abdominal pain, diarrhea and abdominal distention.  Endocrine: Negative for polydipsia, polyphagia and polyuria.  Genitourinary: Negative for dysuria, frequency and hematuria.       Perirectal and right gluteal pain.  Musculoskeletal: Negative for gait problem.  Skin: Positive for wound. Negative for color change, pallor and rash.  Neurological: Negative for dizziness, syncope, light-headedness and headaches.  Hematological: Does not bruise/bleed easily.  Psychiatric/Behavioral: Negative for behavioral problems and confusion.      Allergies  Penicillins; Tylenol; Hydrocodone; and Other  Home Medications   Prior to Admission medications   Medication Sig Start Date End Date Taking? Authorizing Provider  aspirin EC 81 MG tablet Take 162 mg by mouth daily.   Yes Historical Provider, MD  ibuprofen (ADVIL,MOTRIN) 200 MG tablet Take 600 mg by mouth every 6 (six) hours as needed for mild pain.    Yes Historical Provider, MD  sulfamethoxazole-trimethoprim (BACTRIM DS,SEPTRA DS) 800-160 MG per tablet Take 1 tablet by mouth 2 (two) times daily. 02/24/15  Yes Mirian MoMatthew Gentry, MD  BP 141/66 mmHg  Pulse 106  Temp(Src) 100.8 F (38.2 C)  Resp 18  SpO2 97% Physical Exam  Constitutional: He is oriented to person, place, and time. He appears well-developed and well-nourished. No distress.  HENT:  Head: Normocephalic.  Eyes: Conjunctivae are normal. Pupils are equal, round, and reactive to light. No scleral icterus.  Neck: Normal range of motion. Neck supple. No thyromegaly present.  Cardiovascular: Normal rate and regular rhythm.  Exam reveals no gallop and no friction rub.   No murmur heard. Pulmonary/Chest: Effort  normal and breath sounds normal. No respiratory distress. He has no wheezes. He has no rales.  Abdominal: Soft. Bowel sounds are normal. He exhibits no distension. There is no tenderness. There is no rebound.  Musculoskeletal: Normal range of motion.       Legs: Neurological: He is alert and oriented to person, place, and time.  Skin: Skin is warm and dry. No rash noted.  Psychiatric: He has a normal mood and affect. His behavior is normal.    ED Course  Procedures (including critical care time) Labs Review Labs Reviewed  CBC WITH DIFFERENTIAL/PLATELET - Abnormal; Notable for the following:    WBC 12.8 (*)    Neutrophils Relative % 82 (*)    Neutro Abs 10.6 (*)    Lymphocytes Relative 10 (*)    All other components within normal limits  URINE CULTURE  CULTURE, BLOOD (ROUTINE X 2)  CULTURE, BLOOD (ROUTINE X 2)  WOUND CULTURE  COMPREHENSIVE METABOLIC PANEL  URINALYSIS, ROUTINE W REFLEX MICROSCOPIC  PROTIME-INR  I-STAT CG4 LACTIC ACID, ED  I-STAT CG4 LACTIC ACID, ED    Imaging Review No results found.   EKG Interpretation None      MDM   Final diagnoses:  Abscess of right buttock    Patient kept nothing by mouth. Given IV pain medications and antiemetics. Care discussed with Dr. Violeta Gelinas. He is here and is evaluated the patient. Planning admission for further evaluation and treatment.    Rolland Porter, MD 02/24/15 [phone]

## 2015-02-25 LAB — BASIC METABOLIC PANEL
Anion gap: 8 (ref 5–15)
BUN: 13 mg/dL (ref 6–23)
CALCIUM: 8.5 mg/dL (ref 8.4–10.5)
CO2: 23 mmol/L (ref 19–32)
CREATININE: 1.13 mg/dL (ref 0.50–1.35)
Chloride: 106 mmol/L (ref 96–112)
GFR calc Af Amer: >90 mL/min (ref >90)
GFR calc non Af Amer: 87 mL/min — ABNORMAL LOW (ref >90)
GLUCOSE: 107 mg/dL — AB (ref 70–99)
Potassium: 4.1 mmol/L (ref 3.5–5.1)
Sodium: 137 mmol/L (ref 135–145)

## 2015-02-25 LAB — CBC
HCT: 42.6 % (ref 39.0–52.0)
HEMATOCRIT: 41.2 % (ref 39.0–52.0)
HEMOGLOBIN: 14.8 g/dL (ref 13.0–17.0)
Hemoglobin: 14.3 g/dL (ref 13.0–17.0)
MCH: 28.8 pg (ref 26.0–34.0)
MCH: 29 pg (ref 26.0–34.0)
MCHC: 34.7 g/dL (ref 30.0–36.0)
MCHC: 34.7 g/dL (ref 30.0–36.0)
MCV: 83.4 fL (ref 78.0–100.0)
MCV: 83.1 fL (ref 78.0–100.0)
Platelets: 175 10*3/uL (ref 150–400)
Platelets: 183 10*3/uL (ref 150–400)
RBC: 4.96 MIL/uL (ref 4.22–5.81)
RBC: 5.11 MIL/uL (ref 4.22–5.81)
RDW: 13.3 % (ref 11.5–15.5)
RDW: 13.3 % (ref 11.5–15.5)
WBC: 11 10*3/uL — ABNORMAL HIGH (ref 4.0–10.5)
WBC: 9.7 10*3/uL (ref 4.0–10.5)

## 2015-02-25 LAB — PROTIME-INR
INR: 1.04 (ref 0.00–1.49)
Prothrombin Time: 13.8 seconds (ref 11.6–15.2)

## 2015-02-25 LAB — CREATININE, SERUM
Creatinine, Ser: 1.32 mg/dL (ref 0.50–1.35)
GFR calc non Af Amer: 72 mL/min — ABNORMAL LOW (ref >90)
GFR, EST AFRICAN AMERICAN: 83 mL/min — AB (ref >90)

## 2015-02-25 MED ORDER — VANCOMYCIN HCL IN DEXTROSE 1-5 GM/200ML-% IV SOLN
1000.0000 mg | Freq: Three times a day (TID) | INTRAVENOUS | Status: DC
  Administered 2015-02-25 – 2015-02-28 (×10): 1000 mg via INTRAVENOUS
  Filled 2015-02-25 (×11): qty 200

## 2015-02-25 MED ORDER — VANCOMYCIN HCL 10 G IV SOLR
2000.0000 mg | Freq: Once | INTRAVENOUS | Status: AC
  Administered 2015-02-25: 2000 mg via INTRAVENOUS
  Filled 2015-02-25: qty 2000

## 2015-02-25 MED ORDER — LORATADINE 10 MG PO TABS
10.0000 mg | ORAL_TABLET | Freq: Every day | ORAL | Status: DC
  Administered 2015-02-25 – 2015-02-28 (×4): 10 mg via ORAL
  Filled 2015-02-25 (×4): qty 1

## 2015-02-25 MED ORDER — NICOTINE 21 MG/24HR TD PT24
21.0000 mg | MEDICATED_PATCH | Freq: Every day | TRANSDERMAL | Status: DC
  Administered 2015-02-25 – 2015-02-28 (×4): 21 mg via TRANSDERMAL
  Filled 2015-02-25 (×4): qty 1

## 2015-02-25 MED ORDER — PANTOPRAZOLE SODIUM 40 MG PO TBEC
40.0000 mg | DELAYED_RELEASE_TABLET | Freq: Every day | ORAL | Status: DC
  Administered 2015-02-25 – 2015-02-27 (×3): 40 mg via ORAL
  Filled 2015-02-25 (×3): qty 1

## 2015-02-25 MED ORDER — PNEUMOCOCCAL VAC POLYVALENT 25 MCG/0.5ML IJ INJ
0.5000 mL | INJECTION | INTRAMUSCULAR | Status: DC
  Filled 2015-02-25: qty 0.5

## 2015-02-25 NOTE — Progress Notes (Signed)
UR Completed.  [phone]  

## 2015-02-25 NOTE — Progress Notes (Signed)
Patient ID: Bryce Morris, male   DOB: 01-12-1986, 29 y.o.   MRN: [id number]     [facility]      [address] [place], [place] [address]    Phone: [phone] FAX: [phone]     Subjective: No changes.  WBC normal.  Afebrile.    Objective:  Vital signs:  Filed Vitals:   02/24/15 2338 02/24/15 2355 02/25/15 0415 02/25/15 0909  BP:  111/68 115/61 99/56  Pulse:  90 93 96  Temp:  99.7 F (37.6 C) 99 F (37.2 C) 99 F (37.2 C)  TempSrc:  Oral Oral Oral  Resp:  _0 Height: 5' 10" (1.778 m)     Weight: 105.1 kg (231 lb 11.3 oz)  107.094 kg (236 lb 1.6 oz)   SpO2:  99% 99% 97%    Last BM Date: 02/23/15  Intake/Output   Yesterday:  04/21 0701 - 04/22 0700 In: 804 [P.O.:804] Out: 625 [Urine:625] This shift:  Total I/O In: 240 [P.O.:240] Out: 775 [Urine:775]   Physical Exam: General: Pt awake/alert/oriented x4 in no acute distress Buttocks: very small wound, surrounding induration and erythema. Wound repacked.    Problem List:   Active Problems:   Abscess of right buttock    Results:   Labs: Results for orders placed or performed during the hospital encounter of 02/24/15 (from the past 48 hour(s))  CBC WITH DIFFERENTIAL     Status: Abnormal   Collection Time: 02/24/15 10:25 PM  Result Value Ref Range   WBC 12.8 (H) 4.0 - 10.5 K/uL   RBC 5.46 4.22 - 5.81 MIL/uL   Hemoglobin 16.1 13.0 - 17.0 g/dL   HCT 45.2 39.0 - 52.0 %   MCV 82.8 78.0 - 100.0 fL   MCH 29.5 26.0 - 34.0 pg   MCHC 35.6 30.0 - 36.0 g/dL   RDW 13.4 11.5 - 15.5 %   Platelets 192 150 - 400 K/uL   Neutrophils Relative % 82 (H) 43 - 77 %   Neutro Abs 10.6 (H) 1.7 - 7.7 K/uL   Lymphocytes Relative 10 (L) 12 - 46 %   Lymphs Abs 1.3 0.7 - 4.0 K/uL   Monocytes Relative 6 3 - 12 %   Monocytes Absolute 0.7 0.1 - 1.0 K/uL   Eosinophils Relative 2 0 - 5 %   Eosinophils Absolute 0.2 0.0 - 0.7 K/uL   Basophils Relative 0 0 - 1 %   Basophils  Absolute 0.0 0.0 - 0.1 K/uL  Comprehensive metabolic panel     Status: Abnormal   Collection Time: 02/24/15 10:25 PM  Result Value Ref Range   Sodium 138 135 - 145 mmol/L   Potassium 4.2 3.5 - 5.1 mmol/L   Chloride 107 96 - 112 mmol/L   CO2 22 19 - 32 mmol/L   Glucose, Bld 115 (H) 70 - 99 mg/dL   BUN 18 6 - 23 mg/dL   Creatinine, Ser 1.19 0.50 - 1.35 mg/dL   Calcium 9.3 8.4 - 10.5 mg/dL   Total Protein 7.1 6.0 - 8.3 g/dL   Albumin 4.3 3.5 - 5.2 g/dL   AST 22 0 - 37 U/L   ALT 33 0 - 53 U/L   Alkaline Phosphatase 51 39 - 117 U/L   Total Bilirubin 0.6 0.3 - 1.2 mg/dL   GFR calc non Af Amer 81 (L) >90 mL/min   GFR calc Af Amer >90 >90 mL/min    Comment: (NOTE)  The eGFR has been calculated using the CKD EPI equation. This calculation has not been validated in all clinical situations. eGFR's persistently <90 mL/min signify possible Chronic Kidney Disease.    Anion gap 9 5 - 15  I-Stat CG4 Lactic Acid, ED     Status: None   Collection Time: 02/24/15 10:44 PM  Result Value Ref Range   Lactic Acid, Venous 0.75 0.5 - 2.0 mmol/L  Urinalysis with microscopic     Status: None   Collection Time: 02/24/15 11:30 PM  Result Value Ref Range   Color, Urine YELLOW YELLOW   APPearance CLEAR CLEAR   Specific Gravity, Urine 1.025 1.005 - 1.030   pH 5.5 5.0 - 8.0   Glucose, UA NEGATIVE NEGATIVE mg/dL   Hgb urine dipstick NEGATIVE NEGATIVE   Bilirubin Urine NEGATIVE NEGATIVE   Ketones, ur NEGATIVE NEGATIVE mg/dL   Protein, ur NEGATIVE NEGATIVE mg/dL   Urobilinogen, UA 0.2 0.0 - 1.0 mg/dL   Nitrite NEGATIVE NEGATIVE   Leukocytes, UA NEGATIVE NEGATIVE    Comment: MICROSCOPIC NOT DONE ON URINES WITH NEGATIVE PROTEIN, BLOOD, LEUKOCYTES, NITRITE, OR GLUCOSE <1000 mg/dL.  Protime-INR     Status: None   Collection Time: 02/25/15 12:35 AM  Result Value Ref Range   Prothrombin Time 13.8 11.6 - 15.2 seconds   INR 1.04 0.00 - 1.49  CBC     Status: Abnormal   Collection Time: 02/25/15 12:35 AM   Result Value Ref Range   WBC 11.0 (H) 4.0 - 10.5 K/uL   RBC 5.11 4.22 - 5.81 MIL/uL   Hemoglobin 14.8 13.0 - 17.0 g/dL   HCT 42.6 39.0 - 52.0 %   MCV 83.4 78.0 - 100.0 fL   MCH 29.0 26.0 - 34.0 pg   MCHC 34.7 30.0 - 36.0 g/dL   RDW 13.3 11.5 - 15.5 %   Platelets 175 150 - 400 K/uL  Creatinine, serum     Status: Abnormal   Collection Time: 02/25/15 12:35 AM  Result Value Ref Range   Creatinine, Ser 1.32 0.50 - 1.35 mg/dL   GFR calc non Af Amer 72 (L) >90 mL/min   GFR calc Af Amer 83 (L) >90 mL/min    Comment: (NOTE) The eGFR has been calculated using the CKD EPI equation. This calculation has not been validated in all clinical situations. eGFR's persistently <90 mL/min signify possible Chronic Kidney Disease.   Basic metabolic panel     Status: Abnormal   Collection Time: 02/25/15  6:18 AM  Result Value Ref Range   Sodium 137 135 - 145 mmol/L   Potassium 4.1 3.5 - 5.1 mmol/L   Chloride 106 96 - 112 mmol/L   CO2 23 19 - 32 mmol/L   Glucose, Bld 107 (H) 70 - 99 mg/dL   BUN 13 6 - 23 mg/dL   Creatinine, Ser 1.13 0.50 - 1.35 mg/dL   Calcium 8.5 8.4 - 10.5 mg/dL   GFR calc non Af Amer 87 (L) >90 mL/min   GFR calc Af Amer >90 >90 mL/min    Comment: (NOTE) The eGFR has been calculated using the CKD EPI equation. This calculation has not been validated in all clinical situations. eGFR's persistently <90 mL/min signify possible Chronic Kidney Disease.    Anion gap 8 5 - 15  CBC     Status: None   Collection Time: 02/25/15  6:18 AM  Result Value Ref Range   WBC 9.7 4.0 - 10.5 K/uL   RBC 4.96 4.22 - 5.81 MIL/uL  Hemoglobin 14.3 13.0 - 17.0 g/dL   HCT 41.2 39.0 - 52.0 %   MCV 83.1 78.0 - 100.0 fL   MCH 28.8 26.0 - 34.0 pg   MCHC 34.7 30.0 - 36.0 g/dL   RDW 13.3 11.5 - 15.5 %   Platelets 183 150 - 400 K/uL    Imaging / Studies: No results found.  Medications / Allergies:  Scheduled Meds: . ciprofloxacin  400 mg Intravenous Q12H  . enoxaparin (LOVENOX) injection  40  mg Subcutaneous Q24H  . metronidazole  500 mg Intravenous Q8H  . nicotine  21 mg Transdermal Daily  . ondansetron      . pantoprazole (PROTONIX) IV  40 mg Intravenous QHS  . [START ON 02/26/2015] pneumococcal 23 valent vaccine  0.5 mL Intramuscular Tomorrow-1000  . vancomycin  1,000 mg Intravenous Q8H   Continuous Infusions: . dextrose 5 % and 0.45 % NaCl with KCl 20 mEq/L 100 mL/hr at 02/25/15 0037   PRN Meds:.ibuprofen, morphine injection, morphine injection, ondansetron, oxyCODONE  Antibiotics: Anti-infectives    Start     Dose/Rate Route Frequency Ordered Stop   02/25/15 0800  vancomycin (VANCOCIN) IVPB 1000 mg/200 mL premix     1,000 mg 200 mL/hr over 60 Minutes Intravenous Every 8 hours 02/25/15 0001     02/25/15 0100  metroNIDAZOLE (FLAGYL) IVPB 500 mg     500 mg 100 mL/hr over 60 Minutes Intravenous Every 8 hours 02/24/15 2349     02/25/15 0030  ciprofloxacin (CIPRO) IVPB 400 mg     400 mg 200 mL/hr over 60 Minutes Intravenous Every 12 hours 02/24/15 2349     02/25/15 0015  vancomycin (VANCOCIN) 2,000 mg in sodium chloride 0.9 % 500 mL IVPB     2,000 mg 250 mL/hr over 120 Minutes Intravenous  Once 02/25/15 0001 02/25/15 0255        Assessment/Plan Right medial buttock abscess with cellulitis -start BID wet to dry dressing changes -Cx--negative to date -c/w Vanc/cipro/flagyl D#1/7 -pain control VTE prophylaxis--SCD/lovenox FEN-reg diet, po pain meds.  Tentatively make NPO after midnight.  IVF Dispo-if no improvement in cellulitis, to OR in am for I&D  Emina Riebock, ANP-BC [facility] Pager [phone](7A-4:30P) For consults and floor pages call [phone](7A-4:30P)  02/25/2015 9:12 AM    

## 2015-02-26 ENCOUNTER — Encounter (HOSPITAL_COMMUNITY): Payer: Self-pay | Admitting: Radiology

## 2015-02-26 ENCOUNTER — Inpatient Hospital Stay (HOSPITAL_COMMUNITY): Payer: 59

## 2015-02-26 LAB — CBC
HCT: 40.8 % (ref 39.0–52.0)
Hemoglobin: 14.2 g/dL (ref 13.0–17.0)
MCH: 29.2 pg (ref 26.0–34.0)
MCHC: 34.8 g/dL (ref 30.0–36.0)
MCV: 83.8 fL (ref 78.0–100.0)
PLATELETS: 154 10*3/uL (ref 150–400)
RBC: 4.87 MIL/uL (ref 4.22–5.81)
RDW: 13.4 % (ref 11.5–15.5)
WBC: 4.9 10*3/uL (ref 4.0–10.5)

## 2015-02-26 LAB — URINE CULTURE
COLONY COUNT: NO GROWTH
CULTURE: NO GROWTH

## 2015-02-26 MED ORDER — MORPHINE SULFATE 2 MG/ML IJ SOLN
2.0000 mg | Freq: Four times a day (QID) | INTRAMUSCULAR | Status: DC | PRN
  Administered 2015-02-26 – 2015-02-28 (×4): 2 mg via INTRAVENOUS
  Filled 2015-02-26 (×4): qty 1

## 2015-02-26 MED ORDER — DOCUSATE SODIUM 100 MG PO CAPS
100.0000 mg | ORAL_CAPSULE | Freq: Two times a day (BID) | ORAL | Status: DC
  Administered 2015-02-26 – 2015-02-28 (×4): 100 mg via ORAL
  Filled 2015-02-26 (×5): qty 1

## 2015-02-26 MED ORDER — OXYCODONE HCL 5 MG PO TABS
5.0000 mg | ORAL_TABLET | ORAL | Status: DC | PRN
  Administered 2015-02-26: 10 mg via ORAL
  Filled 2015-02-26: qty 2

## 2015-02-26 MED ORDER — POLYETHYLENE GLYCOL 3350 17 G PO PACK
17.0000 g | PACK | Freq: Every day | ORAL | Status: DC | PRN
  Filled 2015-02-26: qty 1

## 2015-02-26 MED ORDER — MAGNESIUM SULFATE GRAN
GRANULES | Freq: Every day | Status: DC
  Administered 2015-02-28: 10:00:00 via TOPICAL
  Filled 2015-02-26: qty 454

## 2015-02-26 NOTE — Progress Notes (Signed)
[facility] Progress Note     Subjective: Pt says pain still there, but improving.  No N/V, tolerating diet.  Ambulating some.  No BM yet, but having flatus.  Objective: Vital signs in last 24 hours: Temp:  [98 F (36.7 C)-98.2 F (36.8 C)] 98 F (36.7 C) (04/23 0520) Pulse Rate:  [65-87] 65 (04/23 0520) Resp:  [18] 18 (04/23 0520) BP: (103-122)/(53-73) 103/53 mmHg (04/23 0520) SpO2:  [98 %-100 %] 98 % (04/23 0520) Weight:  [105.235 kg (232 lb)] 105.235 kg (232 lb) (04/22 2002) Last BM Date: 02/25/15  Intake/Output from previous day: 04/22 0701 - 04/23 0700 In: 5018.3 [P.O.:480; I.V.:2938.3; IV Piggyback:1600] Out: 775 [Urine:775] Intake/Output this shift:    PE: Gen:  Alert, NAD, pleasant Perirectal:  1.5cm incision with bloody drainage.  No purulent drainage.  Peri-wound is erythematous and induration, probed wound and did not find any connecting loculations.     Lab Results:   Recent Labs  02/25/15 0618 02/26/15 0655  WBC 9.7 4.9  HGB 14.3 14.2  HCT 41.2 40.8  PLT 183 154   BMET  Recent Labs  02/24/15 2225 02/25/15 0035 02/25/15 0618  NA 138  --  137  K 4.2  --  4.1  CL 107  --  106  CO2 22  --  23  GLUCOSE 115*  --  107*  BUN 18  --  13  CREATININE 1.19 1.32 1.13  CALCIUM 9.3  --  8.5   PT/INR  Recent Labs  02/25/15 0035  LABPROT 13.8  INR 1.04   CMP     Component Value Date/Time   NA 137 02/25/2015 0618   K 4.1 02/25/2015 0618   CL 106 02/25/2015 0618   CO2 23 02/25/2015 0618   GLUCOSE 107* 02/25/2015 0618   BUN 13 02/25/2015 0618   CREATININE 1.13 02/25/2015 0618   CALCIUM 8.5 02/25/2015 0618   PROT 7.1 02/24/2015 2225   ALBUMIN 4.3 02/24/2015 2225   AST 22 02/24/2015 2225   ALT 33 02/24/2015 2225   ALKPHOS 51 02/24/2015 2225   BILITOT 0.6 02/24/2015 2225   GFRNONAA 87* 02/25/2015 0618   GFRAA >90 02/25/2015 0618   Lipase  No results found for: LIPASE     Studies/Results: No results  found.  Anti-infectives: Anti-infectives    Start     Dose/Rate Route Frequency Ordered Stop   02/25/15 0800  vancomycin (VANCOCIN) IVPB 1000 mg/200 mL premix     1,000 mg 200 mL/hr over 60 Minutes Intravenous Every 8 hours 02/25/15 0001     02/25/15 0100  metroNIDAZOLE (FLAGYL) IVPB 500 mg     500 mg 100 mL/hr over 60 Minutes Intravenous Every 8 hours 02/24/15 2349     02/25/15 0030  ciprofloxacin (CIPRO) IVPB 400 mg     400 mg 200 mL/hr over 60 Minutes Intravenous Every 12 hours 02/24/15 2349     02/25/15 0015  vancomycin (VANCOCIN) 2,000 mg in sodium chloride 0.9 % 500 mL IVPB     2,000 mg 250 mL/hr over 120 Minutes Intravenous  Once 02/25/15 0001 02/25/15 0255       Assessment/Plan Right medial buttock abscess with cellulitis -Cont BID wet to dry dressing changes -Stiz baths, shower -Cx--wound culture in process, blood cx NGTD, urine cx NGTD -c/w Vanc/cipro/flagyl D#2/7 -pain control, stool softeners Leukocytosis-resolved VTE prophylaxis--SCD/lovenox FEN-reg diet, po pain meds. Tentatively make NPO after midnight. IVF Dispo-if no improvement in cellulitis, to OR in am for I&D  LOS: 2 days    Aris Georgia 02/26/2015, 11:47 AM Pager: [phone]

## 2015-02-27 LAB — BASIC METABOLIC PANEL
ANION GAP: 7 (ref 5–15)
BUN: 12 mg/dL (ref 6–23)
CALCIUM: 8.9 mg/dL (ref 8.4–10.5)
CO2: 26 mmol/L (ref 19–32)
CREATININE: 0.92 mg/dL (ref 0.50–1.35)
Chloride: 105 mmol/L (ref 96–112)
GFR calc Af Amer: >90 mL/min (ref >90)
Glucose, Bld: 121 mg/dL — ABNORMAL HIGH (ref 70–99)
Potassium: 4.5 mmol/L (ref 3.5–5.1)
Sodium: 138 mmol/L (ref 135–145)

## 2015-02-27 NOTE — Progress Notes (Signed)
Subjective: He feels better today than yesterday  Objective: Vital signs in last 24 hours: Temp:  [97.8 F (36.6 C)-98.5 F (36.9 C)] 98.4 F (36.9 C) (04/24 0841) Pulse Rate:  [63-75] 75 (04/24 0841) Resp:  [17-18] 17 (04/24 0841) BP: (99-103)/(49-66) 103/62 mmHg (04/24 0841) SpO2:  [97 %-99 %] 97 % (04/24 0841) Weight:  [105.235 kg (232 lb)] 105.235 kg (232 lb) (04/23 2004) Last BM Date: 02/26/15  Intake/Output from previous day: 04/23 0701 - 04/24 0700 In: 1280 [P.O.:240; I.V.:540; IV Piggyback:500] Out: -  Intake/Output this shift:    Less tenderness at right perirectal abscess site.  Erythema slightly less  Lab Results:   Recent Labs  02/25/15 0618 02/26/15 0655  WBC 9.7 4.9  HGB 14.3 14.2  HCT 41.2 40.8  PLT 183 154   BMET  Recent Labs  02/24/15 2225 02/25/15 0035 02/25/15 0618  NA 138  --  137  K 4.2  --  4.1  CL 107  --  106  CO2 22  --  23  GLUCOSE 115*  --  107*  BUN 18  --  13  CREATININE 1.19 1.32 1.13  CALCIUM 9.3  --  8.5   PT/INR  Recent Labs  02/25/15 0035  LABPROT 13.8  INR 1.04   ABG No results for input(s): PHART, HCO3 in the last 72 hours.  Invalid input(s): PCO2, PO2  Studies/Results: Ct Pelvis Wo Contrast  02/26/2015   CLINICAL DATA:  Persistent cellulitis following incision and drainage of perianal abscess. Rule out residual abscess  EXAM: CT PELVIS WITHOUT CONTRAST  TECHNIQUE: Multidetector CT imaging of the pelvis was performed following the standard protocol without intravenous contrast.  COMPARISON:  CT abdomen pelvis 03/09/2012  FINDINGS: Cellulitis changes in the right medial buttock. There is gas in the soft tissues from recent drainage. This is in the subcutaneous tissues, approximately 4 cm posterior to the anus. This is technically not a perianal abscess. No residual fluid collection. Edema is present in the subcutaneous fat with skin thickening in the area compatible with cellulitis.  Left buttock is normal.   Perianal tissues are normal.  No mass or adenopathy in the pelvis. No free fluid. Mild prostate enlargement. Urinary bladder is normal. No skeletal abnormality  IMPRESSION: Cellulitis of the right medial buttock. There is gas in the soft tissues from recent drainage. No residual abscess identified.   Electronically Signed   By: Marlan Palau M.D.   On: 02/26/2015 14:40    Anti-infectives: Anti-infectives    Start     Dose/Rate Route Frequency Ordered Stop   02/25/15 0800  vancomycin (VANCOCIN) IVPB 1000 mg/200 mL premix     1,000 mg 200 mL/hr over 60 Minutes Intravenous Every 8 hours 02/25/15 0001     02/25/15 0100  metroNIDAZOLE (FLAGYL) IVPB 500 mg     500 mg 100 mL/hr over 60 Minutes Intravenous Every 8 hours 02/24/15 2349     02/25/15 0030  ciprofloxacin (CIPRO) IVPB 400 mg     400 mg 200 mL/hr over 60 Minutes Intravenous Every 12 hours 02/24/15 2349     02/25/15 0015  vancomycin (VANCOCIN) 2,000 mg in sodium chloride 0.9 % 500 mL IVPB     2,000 mg 250 mL/hr over 120 Minutes Intravenous  Once 02/25/15 0001 02/25/15 0255      Assessment/Plan: S/p I and D of perirectal abscess  CT did not show any further deep fluid collection.  Will resume diet and continue current care  LOS: 3  days    Bayley Yarborough A 02/27/2015

## 2015-02-27 NOTE — Progress Notes (Signed)
ANTIBIOTIC CONSULT NOTE  Pharmacy Consult for vancomycin Indication: cellulitis/abscess of inner buttocks  Allergies  Allergen Reactions  . Penicillins Hives, Shortness Of Breath and Swelling  . Tylenol [Acetaminophen] Other (See Comments)    Hep C  . Hydrocodone Nausea Only  . Other Rash    LOTIONS    Patient Measurements: Height: 5\' 10"  (177.8 cm) Weight: 232 lb (105.235 kg) IBW/kg (Calculated) : 73  Vital Signs: Temp: 98.4 F (36.9 C) (04/24 0841) Temp Source: Oral (04/24 0841) BP: 103/62 mmHg (04/24 0841) Pulse Rate: 75 (04/24 0841)  Labs:  Recent Labs  02/24/15 2225 02/25/15 0035 02/25/15 0618 02/26/15 0655  WBC 12.8* 11.0* 9.7 4.9  HGB 16.1 14.8 14.3 14.2  PLT 192 175 183 154  CREATININE 1.19 1.32 1.13  --    Estimated Creatinine Clearance: 117.2 mL/min (by C-G formula based on Cr of 1.13).  Medical History: Past Medical History  Diagnosis Date  . Asthma childhood/teen yrs.  Marland Kitchen. Hx of concussion 2 yrs. ago  . Hepatitis     Hep. C  . History of indigestion     TUMS as needed  . ADD (attention deficit disorder)     no current med.  . Nasal bone fx-closed 03/09/2012  . Complication of anesthesia     states is hard to wake up post-op  . Arthritis     hands  . Abrasions of multiple sites 03/09/2012    scalp, arms, left ankle  . Scalp laceration 03/09/2012    staples x 2    Medications:  Prescriptions prior to admission  Medication Sig Dispense Refill Last Dose  . aspirin EC 81 MG tablet Take 162 mg by mouth daily.   02/24/2015 at Unknown time  . ibuprofen (ADVIL,MOTRIN) 200 MG tablet Take 600 mg by mouth every 6 (six) hours as needed for mild pain.    02/24/2015 at Unknown time  . sulfamethoxazole-trimethoprim (BACTRIM DS,SEPTRA DS) 800-160 MG per tablet Take 1 tablet by mouth 2 (two) times daily. 14 tablet 0 02/24/2015 at Unknown time   Scheduled:  . ciprofloxacin  400 mg Intravenous Q12H  . docusate sodium  100 mg Oral BID  . enoxaparin (LOVENOX)  injection  40 mg Subcutaneous Q24H  . loratadine  10 mg Oral Daily  . magnesium sulfate   Topical Daily  . metronidazole  500 mg Intravenous Q8H  . nicotine  21 mg Transdermal Daily  . pantoprazole  40 mg Oral Q supper  . pneumococcal 23 valent vaccine  0.5 mL Intramuscular Tomorrow-1000  . vancomycin  1,000 mg Intravenous Q8H   Infusions:  . dextrose 5 % and 0.45 % NaCl with KCl 20 mEq/L 10 mL/hr (02/26/15 1202)    Assessment: 28yo male continuing on vancomycin/cipro/flagyl for abscess of inner buttocks with cellulitis. Had bedside I&D and draining well. Will hopefully avoid OR per MD note. Failed outpatient septra. Afeb, wbc wnl (labs from 4/22), LA wnl. HCV hx. CT - no evidence of further deep fluid collection. SCr 1.32>>1.13 on 4/22, UOP not recorded for last 24h. To follow-up SCr ordered for today.  Vanc 4/22>> Cipro 4/22>> Flagyl 4/22>>  4/21 Urine>>ngf 4/21 Wound>> 4/21 BCx2>>ngtd  Goal of Therapy:  Vancomycin trough level 10-15 mcg/ml  Plan:  Vanc 1 g IV q8h Cipro 400mg  IV q12h Flagyl 500mg  IV q8h F/u clinical progress, c/s, renal function, abx plan Check Css VTss as indicated BMET ordered  Babs BertinHaley Virtie Bungert, PharmD Clinical Pharmacist - Resident Pager [phone] 02/27/2015 11:01 AM

## 2015-02-28 MED ORDER — TRAMADOL HCL 50 MG PO TABS: 50.0000 mg | ORAL_TABLET | Freq: Four times a day (QID) | ORAL | Status: DC | PRN

## 2015-02-28 NOTE — Progress Notes (Signed)
Bryce Morris to be D/C'd Home per MD order.  Discussed prescriptions and follow up appointments with the patient. Prescriptions given to patient, medication list explained in detail. Pt verbalized understanding. No home health ordered by MD.     Medication List    TAKE these medications        aspirin EC 81 MG tablet  Take 162 mg by mouth daily.     ibuprofen 200 MG tablet  Commonly known as:  ADVIL,MOTRIN  Take 600 mg by mouth every 6 (six) hours as needed for mild pain.     sulfamethoxazole-trimethoprim 800-160 MG per tablet  Commonly known as:  BACTRIM DS,SEPTRA DS  Take 1 tablet by mouth 2 (two) times daily.     traMADol 50 MG tablet  Commonly known as:  ULTRAM  Take 1 tablet (50 mg total) by mouth every 6 (six) hours as needed for moderate pain.        Filed Vitals:   02/28/15 0834  BP: 102/61  Pulse: 53  Temp: 97.8 F (36.6 C)  Resp: 18    Skin clean, dry and intact without evidence of skin break down, no evidence of skin tears noted. IV catheter discontinued intact. Site without signs and symptoms of complications. Dressing and pressure applied. Pt denies pain at this time. No complaints noted.  An After Visit Summary was printed and given to the patient. Patient escorted via WC, and D/C home via private auto.  Ragan Reale A 02/28/2015 2:13 PM

## 2015-02-28 NOTE — Care Management Note (Signed)
CARE MANAGEMENT NOTE 02/28/2015  Patient:  Bryce ChattersSWAIM,Dequandre A   Account Number:  [id number]  Date Initiated:  02/25/2015  Documentation initiated by:  Jermell Holeman  Subjective/Objective Assessment:   CM following for progression and d/c planing.     Action/Plan:   02/28/2015 Pt for d/c to home today, HH will not be needed.   Anticipated DC Date:  02/28/2015   Anticipated DC Plan:  HOME/SELF CARE         Choice offered to / List presented to:             Status of service:  Completed, signed off Medicare Important Message given?  NO (If response is "NO", the following Medicare IM given date fields will be blank) Date Medicare IM given:   Medicare IM given by:   Date Additional Medicare IM given:   Additional Medicare IM given by:    Discharge Disposition:  HOME/SELF CARE  Per UR Regulation:    If discussed at Long Length of Stay Meetings, dates discussed:    Comments:

## 2015-02-28 NOTE — Progress Notes (Signed)
Pt. Requesting Ibuprofen, not wanting to take narcotic,PA , Earl Galasborne okay with administration.

## 2015-02-28 NOTE — Discharge Summary (Signed)
Patient ID: [name] MRN: [id number] DOB/AGE: 1986-09-03 29 y.o.  Admit date: 02/24/2015 Discharge date: 02/28/2015  Procedures: none this admit, I&D by ED on 02-23-15  Consults: None  Reason for Admission: Bryce Morris initially presented to the ED last night with a two-week history of a painful area on his right medial buttock. He was diagnosed with buttock abscess and underwent incision and drainage. He was prescribed Bactrim and discharged. At home, he continued to have pain and developed fever and chills. He returned to the emergency department tonight. I was asked to see him for evaluation of this abscess.  Admission Diagnoses:  1. S/p incision and drainage of perirectal abscess with continued cellulitis  Hospital Course: The patient was admitted and started on IV abx therapy.  No further drainage was felt to be needed, but on HD2, the patient did not feel much better and therefore a CT of the pelvis was completed to make sure there was no further infection that was undrained.  This was negative.  The patient did begin to improve and by HD 4, his cellulitis was almost resolved and he was stable for dc home.  PE: Skin: minimal cellulitis, no induration, no purulent drainage.  Packing in place  Discharge Diagnoses:  Active Problems:   Abscess of right buttock   Discharge Medications:   Medication List    TAKE these medications        aspirin EC 81 MG tablet  Take 162 mg by mouth daily.     ibuprofen 200 MG tablet  Commonly known as:  ADVIL,MOTRIN  Take 600 mg by mouth every 6 (six) hours as needed for mild pain.     sulfamethoxazole-trimethoprim 800-160 MG per tablet  Commonly known as:  BACTRIM DS,SEPTRA DS  Take 1 tablet by mouth 2 (two) times daily.     traMADol 50 MG tablet  Commonly known as:  ULTRAM  Take 1 tablet (50 mg total) by mouth every 6 (six) hours as needed for moderate pain.        Discharge Instructions:     Follow-up Information    Follow up  with [facility] On 03/15/2015.   Specialty:  General Surgery   Why:  doc of the week clinic, 3:45pm, arrive no later than 3:15pm   Contact information:   [address] [place] [address] [phone]     patient will stop dressing changes and convert to Sitz Bathes at home.  Signed: Letha CapeSBORNE,Bryce Morris 02/28/2015, 11:23 AM

## 2015-02-28 NOTE — Discharge Instructions (Signed)
Sitz Bath °A sitz bath is a warm water bath taken in the sitting position that covers only the hips and buttocks. It may be used for either healing or hygiene purposes. Sitz baths are also used to relieve pain, itching, or muscle spasms. The water may contain medicine. Moist heat will help you heal and relax.  °HOME CARE INSTRUCTIONS  °Take 3 to 4 sitz baths a day. °1. Fill the bathtub half full with warm water. °2. Sit in the water and open the drain a little. °3. Turn on the warm water to keep the tub half full. Keep the water running constantly. °4. Soak in the water for 15 to 20 minutes. °5. After the sitz bath, pat the affected area dry first. °SEEK MEDICAL CARE IF:  °You get worse instead of better. Stop the sitz baths if you get worse. °MAKE SURE YOU: °· Understand these instructions. °· Will watch your condition. °· Will get help right away if you are not doing well or get worse. °Document Released: 07/14/2004 Document Revised: 07/16/2012 Document Reviewed: 01/19/2011 °ExitCare® Patient Information ©2015 [employer]. This information is not intended to replace advice given to you by your health care provider. Make sure you discuss any questions you have with your health care provider. ° °

## 2015-03-01 LAB — CULTURE, ROUTINE-ABSCESS

## 2015-03-03 LAB — CULTURE, BLOOD (ROUTINE X 2)
CULTURE: NO GROWTH
CULTURE: NO GROWTH

## 2016-06-14 ENCOUNTER — Other Ambulatory Visit (HOSPITAL_COMMUNITY): Payer: Self-pay | Admitting: Gastroenterology

## 2016-06-14 DIAGNOSIS — B1920 Unspecified viral hepatitis C without hepatic coma: Secondary | ICD-10-CM

## 2016-07-12 ENCOUNTER — Ambulatory Visit (HOSPITAL_COMMUNITY): Payer: 59

## 2016-08-01 ENCOUNTER — Ambulatory Visit (HOSPITAL_COMMUNITY): Payer: 59

## 2016-09-06 ENCOUNTER — Ambulatory Visit (HOSPITAL_COMMUNITY)
Admission: RE | Admit: 2016-09-06 | Discharge: 2016-09-06 | Disposition: A | Discharge status: Home / Self Care | Payer: 59 | Source: Ambulatory Visit | Attending: Gastroenterology | Admitting: Gastroenterology

## 2016-09-06 DIAGNOSIS — B1920 Unspecified viral hepatitis C without hepatic coma: Secondary | ICD-10-CM | POA: Insufficient documentation

## 2016-09-06 DIAGNOSIS — R161 Splenomegaly, not elsewhere classified: Secondary | ICD-10-CM | POA: Diagnosis not present

## 2016-09-06 DIAGNOSIS — K824 Cholesterolosis of gallbladder: Secondary | ICD-10-CM | POA: Diagnosis not present

## 2017-05-01 DIAGNOSIS — M545 Low back pain: Secondary | ICD-10-CM | POA: Diagnosis not present

## 2017-05-01 DIAGNOSIS — K219 Gastro-esophageal reflux disease without esophagitis: Secondary | ICD-10-CM | POA: Diagnosis not present

## 2017-10-01 DIAGNOSIS — M546 Pain in thoracic spine: Secondary | ICD-10-CM | POA: Diagnosis not present

## 2017-10-01 DIAGNOSIS — Z0001 Encounter for general adult medical examination with abnormal findings: Secondary | ICD-10-CM | POA: Diagnosis not present

## 2017-10-01 DIAGNOSIS — K219 Gastro-esophageal reflux disease without esophagitis: Secondary | ICD-10-CM | POA: Diagnosis not present

## 2017-11-29 IMAGING — US US ABDOMEN COMPLETE W/ ELASTOGRAPHY
1 series · 13 of 14 positions shown · non-contrast
Comparison: None.

CLINICAL DATA: Chronic hepatitis-C.



[Series 1: us abdomen complete w/ elastography · 0.17mm/px · 13 of 14 slices shown]
[im 1/14]
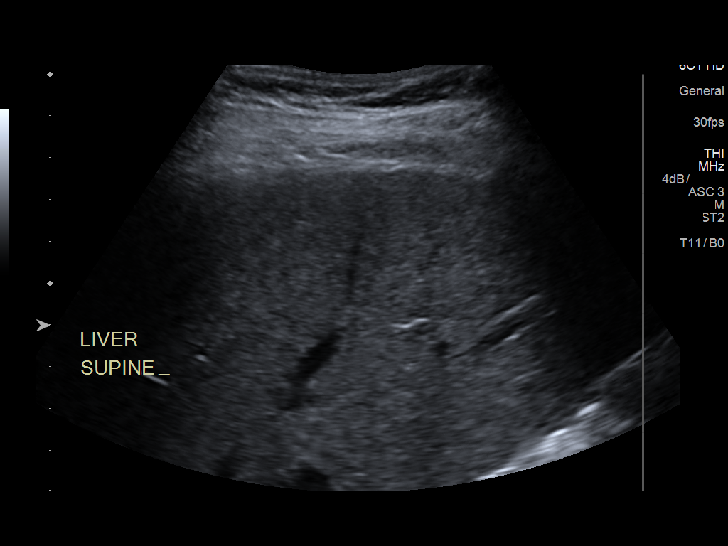
[im 2/14]
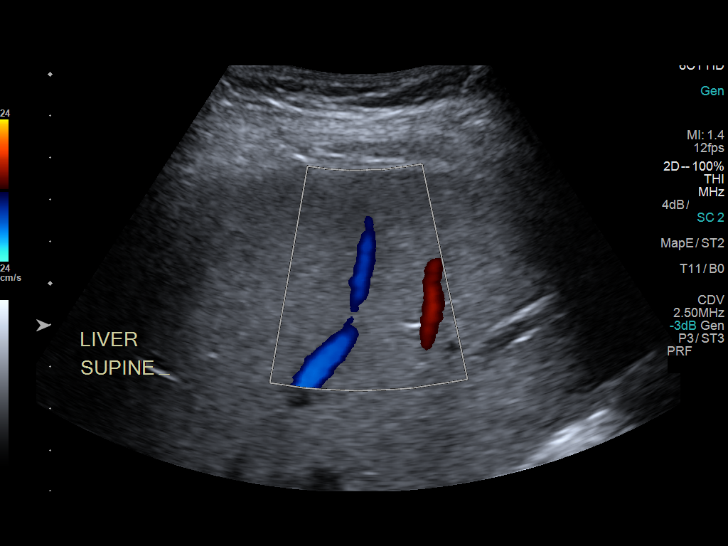
[im 3/14]
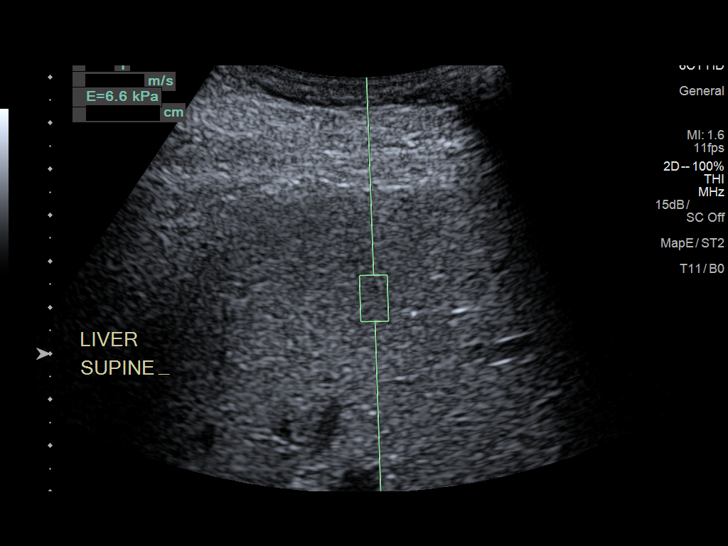
[im 4/14]
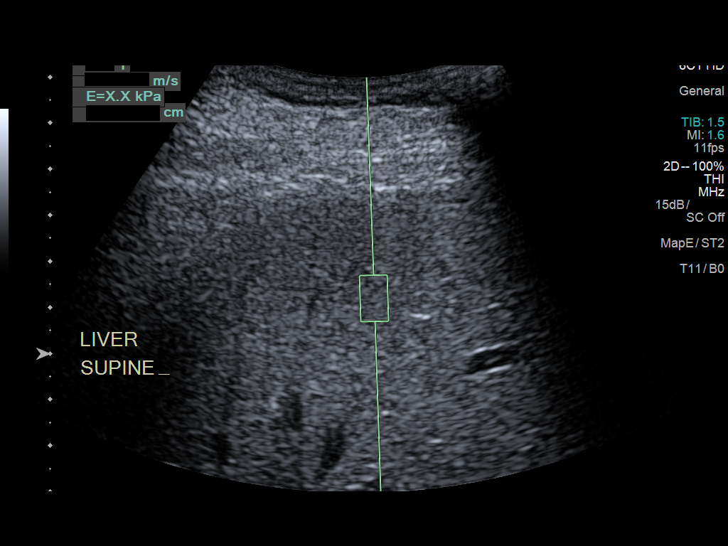
[im 5/14]
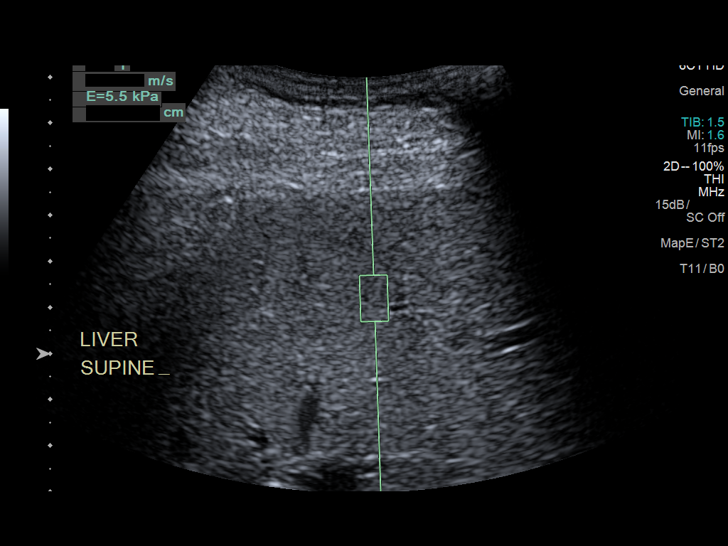
[im 6/14]
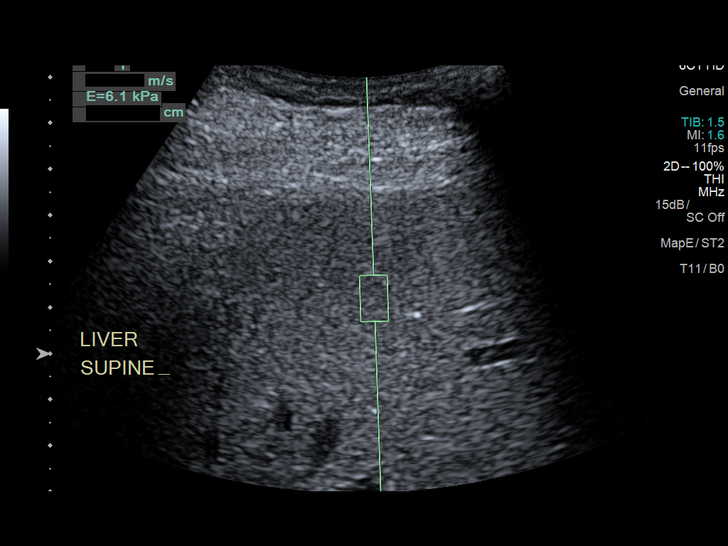
[im 8/14]
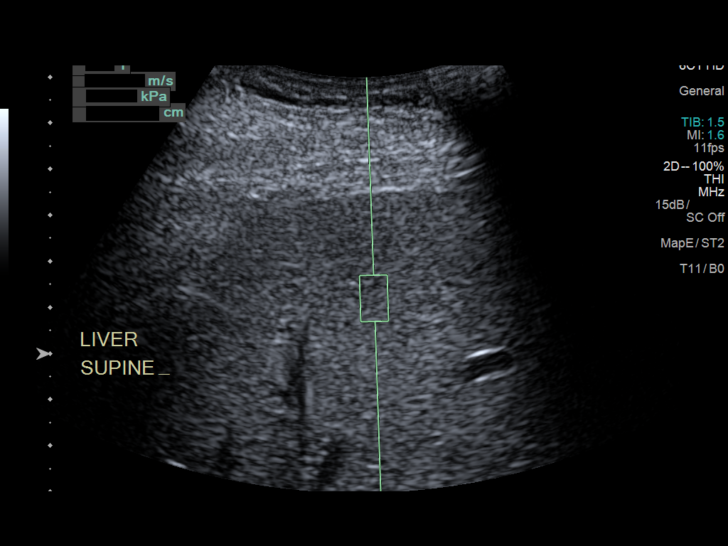
[im 9/14]
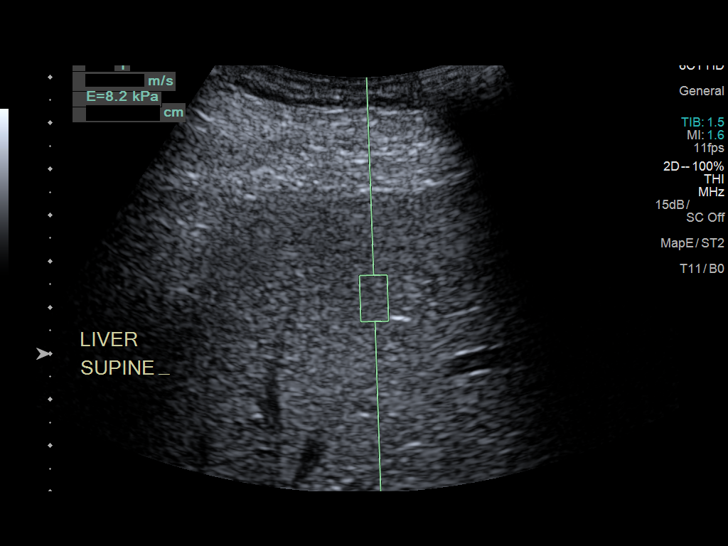
[im 10/14]
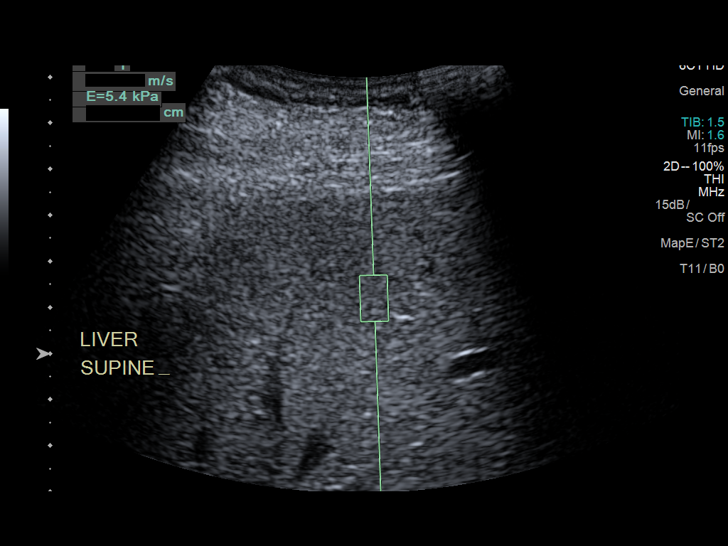
[im 11/14]
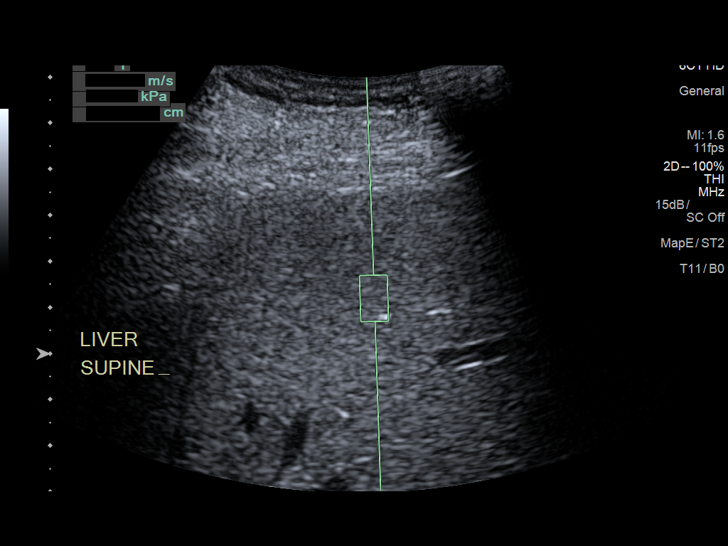
[im 12/14]
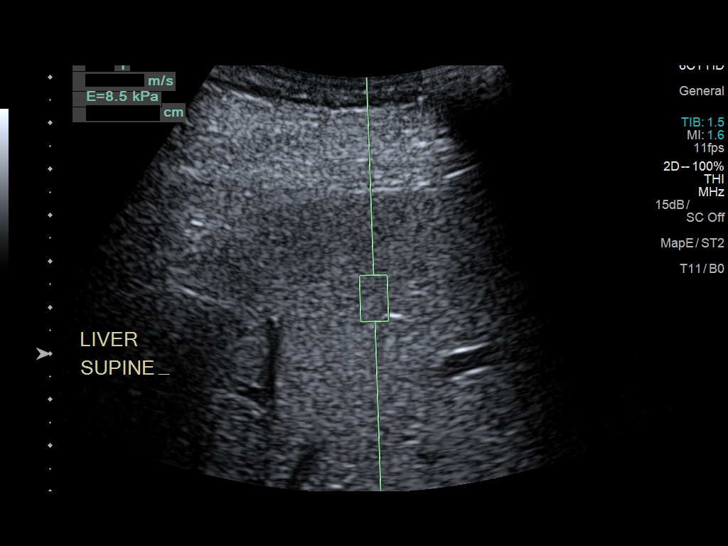
[im 13/14]
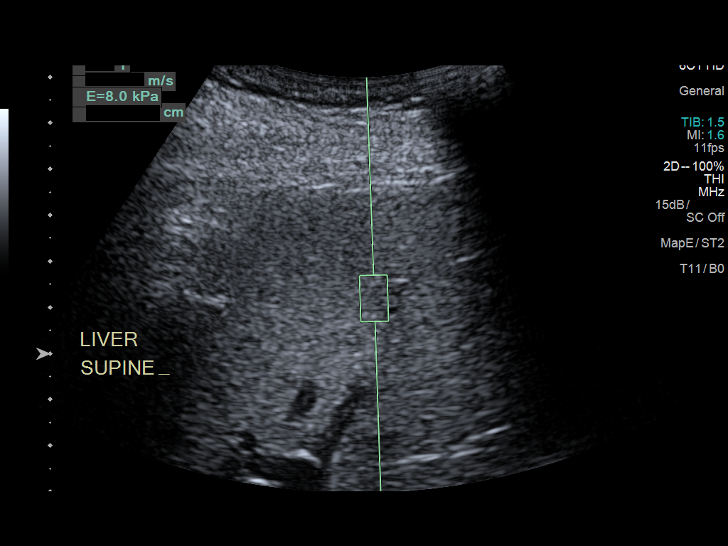
[im 14/14]
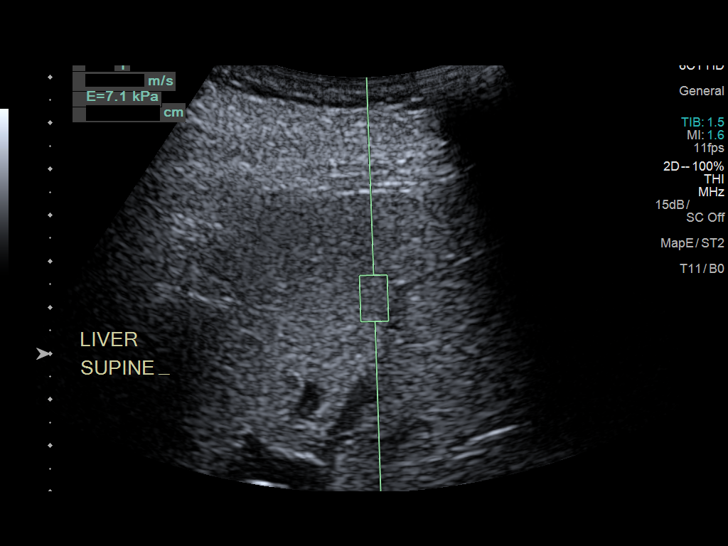

[13 of 14 positions shown; findings below may reference images not displayed]

FINDINGS: ULTRASOUND ABDOMEN

Gallbladder: A non mobile, non shadowing polyp is seen along the
dependent gallbladder wall measuring 6 mm. No gallstones are
identified, and there is no evidence of gallbladder wall thickening
or pericholecystic fluid.

Common bile duct: Diameter: 4 mm, within normal limits.

Liver: No focal lesion identified. Within normal limits in
parenchymal echogenicity.

IVC: No abnormality visualized.

Pancreas: Visualized portion unremarkable.

Spleen: Splenomegaly is demonstrated, with splenic length of
approximately 16 cm and estimated volume of 2489 mL. No splenic
masses identified.

Right Kidney: Length: 12.0 cm. Echogenicity within normal limits. No
mass or hydronephrosis visualized.

Left Kidney: Length: 12.9 cm. Echogenicity within normal limits. No
mass or hydronephrosis visualized.

Abdominal aorta: No aneurysm visualized.

Other findings: None.

ULTRASOUND HEPATIC ELASTOGRAPHY

Device: Siemens Helix VTQ

Patient position: Supine

Transducer [id number]

Number of measurements: 10

Hepatic segment:  8

Median velocity:   1.59  m/sec

IQR:

IQR/Median velocity ratio:

Corresponding Metavir fibrosis score:  F2 + some F3

Risk of fibrosis: Moderate

Limitations of exam: None

Pertinent findings noted on other imaging exams:  None

Please note that abnormal shear wave velocities may also be
identified in clinical settings other than with hepatic fibrosis,
such as: acute hepatitis, elevated right heart and central venous
pressures including use of beta blockers, Alickaj disease
(Alon), infiltrative processes such as
mastocytosis/amyloidosis/infiltrative tumor, extrahepatic
cholestasis, in the post-prandial state, and liver transplantation.
Correlation with patient history, laboratory data, and clinical
condition recommended.
IMPRESSION: ULTRASOUND ABDOMEN:
6 mm gallbladder polyp incidentally noted. No definite gallstones or
biliary ductal dilatation.

Unremarkable sonographic appearance of liver.

Mild to moderate splenomegaly.

ULTRASOUND HEPATIC ELASTOGRAPHY:

Median hepatic shear wave velocity is calculated at 1.59 m/sec.

Corresponding Metavir fibrosis score is  F2 + some F3.

Risk of fibrosis is Moderate.

Follow-up: Additional testing appropriate

## 2018-02-27 ENCOUNTER — Encounter: Payer: 59 | Admitting: Podiatry

## 2018-03-12 ENCOUNTER — Other Ambulatory Visit: Payer: Self-pay

## 2018-03-12 ENCOUNTER — Ambulatory Visit: Payer: 59 | Admitting: Podiatry

## 2018-03-20 NOTE — Progress Notes (Signed)
This encounter was created in error - please disregard.

## 2018-04-02 ENCOUNTER — Encounter: Payer: 59 | Admitting: Podiatry

## 2018-07-14 NOTE — Progress Notes (Signed)
This encounter was created in error - please disregard.

## 2018-12-27 ENCOUNTER — Encounter (HOSPITAL_BASED_OUTPATIENT_CLINIC_OR_DEPARTMENT_OTHER): Payer: Self-pay | Admitting: *Deleted

## 2018-12-27 ENCOUNTER — Other Ambulatory Visit: Payer: Self-pay

## 2018-12-27 ENCOUNTER — Emergency Department (HOSPITAL_BASED_OUTPATIENT_CLINIC_OR_DEPARTMENT_OTHER)
Admission: EM | Admit: 2018-12-27 | Discharge: 2018-12-28 | Disposition: A | Discharge status: Home / Self Care | Payer: 59 | Attending: Emergency Medicine | Admitting: Emergency Medicine

## 2018-12-27 DIAGNOSIS — F909 Attention-deficit hyperactivity disorder, unspecified type: Secondary | ICD-10-CM | POA: Insufficient documentation

## 2018-12-27 DIAGNOSIS — L02411 Cutaneous abscess of right axilla: Secondary | ICD-10-CM | POA: Insufficient documentation

## 2018-12-27 DIAGNOSIS — L0291 Cutaneous abscess, unspecified: Secondary | ICD-10-CM

## 2018-12-27 DIAGNOSIS — F17210 Nicotine dependence, cigarettes, uncomplicated: Secondary | ICD-10-CM | POA: Insufficient documentation

## 2018-12-27 DIAGNOSIS — Z79899 Other long term (current) drug therapy: Secondary | ICD-10-CM | POA: Insufficient documentation

## 2018-12-27 DIAGNOSIS — J45909 Unspecified asthma, uncomplicated: Secondary | ICD-10-CM | POA: Insufficient documentation

## 2018-12-27 MED ORDER — LIDOCAINE-EPINEPHRINE (PF) 2 %-1:200000 IJ SOLN
INTRAMUSCULAR | Status: AC
  Filled 2018-12-27: qty 10

## 2018-12-27 MED ORDER — IBUPROFEN 800 MG PO TABS
800.0000 mg | ORAL_TABLET | Freq: Once | ORAL | Status: AC
  Administered 2018-12-27: 800 mg via ORAL
  Filled 2018-12-27: qty 1

## 2018-12-27 NOTE — ED Provider Notes (Signed)
[facility] EMERGENCY DEPARTMENT Provider Note   CSN: [id number] Arrival date & time: 12/27/18  2046    History   Chief Complaint Chief Complaint  Patient presents with  . Abscess    HPI Bryce Morris is a 33 y.o. male presents for evaluation of redness, pain, swelling to the right axilla x5 days.  He reports that initially, the area started off small approximate 5 days ago.  He states that since then, is gotten bigger.  He reports that today while he was in the shower, he started draining and significantly improved his pain but given that it got bigger, he was concerned about spreading infection.  He does report he has history of pilonidal abscess that caused him to be septic as he was concerned.  He reports he has not had any fevers.  He has been taking Tylenol/ibuprofen with some improvement in pain.     The history is provided by the patient.    Past Medical History:  Diagnosis Date  . Abrasions of multiple sites 03/09/2012   scalp, arms, left ankle  . ADD (attention deficit disorder)    no current med.  . Arthritis    hands  . Asthma childhood/teen yrs.  . Complication of anesthesia    states is hard to wake up post-op  . Hepatitis    Hep. C  . History of indigestion    TUMS as needed  . Hx of concussion 2 yrs. ago  . Nasal bone fx-closed 03/09/2012  . Scalp laceration 03/09/2012   staples x 2    Patient Active Problem List   Diagnosis Date Noted  . Abscess of right buttock 02/24/2015    Past Surgical History:  Procedure Laterality Date  . CLOSED REDUCTION NASAL FRACTURE  03/13/2012   Procedure: CLOSED REDUCTION NASAL FRACTURE;  Surgeon: Drema Halon, MD;  Location: [facility];  Service: ENT;  Laterality: N/A;  CLOSED REDUCTION NASAL SEPTAL FRACTURE   . DENTAL SURGERY     multiple  . FINGER SURGERY  01/17/2010   repair left index finger ulnar nerve/artery and FDP lac.  Marland Kitchen LIVER BIOPSY          Home Medications    Prior to  Admission medications   Medication Sig Start Date End Date Taking? Authorizing Provider  aspirin EC 81 MG tablet Take 162 mg by mouth daily.    [provider]  dexamethasone (DECADRON) 0.75 MG tablet  01/16/18   [provider]  ibuprofen (ADVIL,MOTRIN) 200 MG tablet Take 600 mg by mouth every 6 (six) hours as needed for mild pain.     [provider]  omeprazole (PRILOSEC) 20 MG capsule TK ONE C PO  ONCE D 12/04/17   [provider]  sulfamethoxazole-trimethoprim (BACTRIM DS,SEPTRA DS) 800-160 MG tablet Take 1 tablet by mouth 2 (two) times daily for 7 days. 12/28/18 01/04/19  Maxwell Caul, PA-C  traMADol (ULTRAM) 50 MG tablet Take 1 tablet (50 mg total) by mouth every 6 (six) hours as needed for moderate pain. 02/28/15   Barnetta Chapel, PA-C    Family History Family History  Problem Relation Age of Onset  . Anesthesia problems Mother        hard to wake up post-op    Social History Social History   Tobacco Use  . Smoking status: Current Every Day Smoker    Packs/day: 1.00    Years: 10.00    Pack years: 10.00    Types:  Cigarettes  . Smokeless tobacco: Never Used  Substance Use Topics  . Alcohol use: Yes    Comment: occasionally  . Drug use: Yes    Types: Marijuana    Comment: last used marijuana 03/08/2012     Allergies   Penicillins; Tylenol [acetaminophen]; Hydrocodone; and Other   Review of Systems Review of Systems  Constitutional: Negative for fever.  Skin: Positive for color change and wound.  All other systems reviewed and are negative.    Physical Exam Updated Vital Signs BP 108/75 (BP Location: Left Arm)   Pulse 74   Temp 97.7 F (36.5 C) (Oral)   Resp 18   Ht 5\' 10"  (1.778 m)   Wt 113.4 kg   SpO2 99%   BMI 35.87 kg/m   Physical Exam Vitals signs and nursing note reviewed.  Constitutional:      Appearance: He is well-developed.  HENT:     Head: Normocephalic and atraumatic.  Eyes:     General: No scleral  icterus.       Right eye: No discharge.        Left eye: No discharge.     Conjunctiva/sclera: Conjunctivae normal.  Pulmonary:     Effort: Pulmonary effort is normal.  Skin:    General: Skin is warm and dry.     Comments: 3 x 4 cm area of warmth, erythema with surrounding central fluctuance.  In the middle aspect, there is a pinpoint hole which shows some purulent drainage.  Neurological:     Mental Status: He is alert.  Psychiatric:        Speech: Speech normal.        Behavior: Behavior normal.      ED Treatments / Results  Labs (all labs ordered are listed, but only abnormal results are displayed) Labs Reviewed  AEROBIC CULTURE (SUPERFICIAL SPECIMEN)    EKG None  Radiology No results found.  Procedures .Marland KitchenIncision and Drainage Date/Time: 12/28/2018 12:11 AM Performed by: Maxwell Caul, PA-C Authorized by: Maxwell Caul, PA-C   Consent:    Consent obtained:  Verbal   Consent given by:  Patient   Risks discussed:  Bleeding, incomplete drainage, pain and damage to other organs   Alternatives discussed:  No treatment Universal protocol:    Procedure explained and questions answered to patient or proxy's satisfaction: yes     Relevant documents present and verified: yes     Test results available and properly labeled: yes     Imaging studies available: yes     Required blood products, implants, devices, and special equipment available: yes     Site/side marked: yes     Immediately prior to procedure a time out was called: yes     Patient identity confirmed:  Verbally with patient Location:    Type:  Abscess   Location:  Upper extremity   Upper extremity location: Right axilla. Pre-procedure details:    Skin preparation:  Betadine Anesthesia (see MAR for exact dosages):    Anesthesia method:  Local infiltration   Local anesthetic:  Lidocaine 1% WITH epi Procedure type:    Complexity:  Complex Procedure details:    Incision types:  Single straight    Incision depth:  Subcutaneous   Scalpel blade:  11   Wound management:  Probed and deloculated, irrigated with saline and extensive cleaning   Drainage:  Purulent   Drainage amount:  Moderate Post-procedure details:    Patient tolerance of procedure:  Tolerated well, no immediate  complications   (including critical care time)  Medications Ordered in ED Medications  lidocaine-EPINEPHrine (XYLOCAINE W/EPI) 2 %-1:200000 (PF) injection (has no administration in time range)  sulfamethoxazole-trimethoprim (BACTRIM DS,SEPTRA DS) 800-160 MG per tablet 1 tablet (has no administration in time range)  ibuprofen (ADVIL,MOTRIN) tablet 800 mg (800 mg Oral Given 12/27/18 2321)     Initial Impression / Assessment and Plan / ED Course  I have reviewed the triage vital signs and the nursing notes.  Pertinent labs & imaging results that were available during my care of the patient were reviewed by me and considered in my medical decision making (see chart for details).        33 year old male who presents for evaluation of pain, redness, swelling to right axilla x5 days.  Reports started draining in the shower today.  No fevers. Patient is afebrile, non-toxic appearing, sitting comfortably on examination table. Vital signs reviewed and stable.  Sam, he has an area of warmth, erythema central fluctuance concerning for abscess.  It is draining slightly purulent drainage.  Suspect that it already started draining while he was in the shower.  We will continue I&D to complete drainage.  I&D as documented above.  Patient tolerated procedure well.  Wound culture sent.  Patient allergic to penicillin.  He is done well on Bactrim before without any difficulties. At this time, patient exhibits no emergent life-threatening condition that require further evaluation in ED or admission. Patient had ample opportunity for questions and discussion. All patient's questions were answered with full understanding. Strict return  precautions discussed. Patient expresses understanding and agreement to plan.   Portions of this note were generated with Scientist, clinical (histocompatibility and immunogenetics). Dictation errors may occur despite best attempts at proofreading.   Final Clinical Impressions(s) / ED Diagnoses   Final diagnoses:  Abscess    ED Discharge Orders         Ordered    sulfamethoxazole-trimethoprim (BACTRIM DS,SEPTRA DS) 800-160 MG tablet  2 times daily     12/28/18 0003           Maxwell Caul, PA-C 12/28/18 0013    Tegeler, Canary Brim, MD 12/28/18 [phone]

## 2018-12-27 NOTE — ED Triage Notes (Signed)
Abscess to right armpit x several days. Reports that it started draining tonight.

## 2018-12-28 MED ORDER — SULFAMETHOXAZOLE-TRIMETHOPRIM 800-160 MG PO TABS
1.0000 | ORAL_TABLET | Freq: Once | ORAL | Status: AC
  Administered 2018-12-28: 1 via ORAL
  Filled 2018-12-28: qty 1

## 2018-12-28 MED ORDER — SULFAMETHOXAZOLE-TRIMETHOPRIM 800-160 MG PO TABS: 1.0000 | ORAL_TABLET | Freq: Two times a day (BID) | ORAL | 0 refills | Status: AC

## 2018-12-28 NOTE — Discharge Instructions (Signed)
Apply warm compresses to the area or soak the area in warm water to help continue express drainage.   Keep the wound clean and dry. Gently wash the wound with soap and water and make sure to pat it dry.    Take antibiotic and complete the entire course.   You can take Tylenol or Ibuprofen as directed for pain.  Followup with Emergency Department  in 2 days for wound recheck.  Return to the Emergency Department if you experienced any worsening/spreading redness or swelling, fever, worsening pain, or any other worsening or concerning symptoms.

## 2018-12-30 LAB — AEROBIC CULTURE  (SUPERFICIAL SPECIMEN): GRAM STAIN: NONE SEEN

## 2018-12-30 LAB — AEROBIC CULTURE W GRAM STAIN (SUPERFICIAL SPECIMEN)

## 2018-12-31 ENCOUNTER — Telehealth: Payer: Self-pay | Admitting: Emergency Medicine

## 2018-12-31 NOTE — Telephone Encounter (Signed)
Post ED Visit - Positive Culture Follow-up  Culture report reviewed by antimicrobial stewardship pharmacist: [facility] []  Enzo Bi, Pharm.D. []  Celedonio Miyamoto, [address].D., BCPS AQ-ID []  Garvin Fila, Pharm.D., BCPS []  Georgina Pillion, Pharm.D., BCPS []  [place], [address].D., BCPS, AAHIVP []  Estella Husk, Pharm.D., BCPS, AAHIVP []  Lysle Pearl, PharmD, BCPS []  Phillips Climes, PharmD, BCPS []  Agapito Games, PharmD, BCPS []  Verlan Friends, PharmD []  Mervyn Gay, PharmD, BCPS []  Vinnie Level, PharmD Babs Bertin PharmD  [facility] []  Len Childs, PharmD []  Greer Pickerel, PharmD []  Adalberto Cole, PharmD []  Perlie Gold, Rph []  Lonell Face) Jean Rosenthal, PharmD []  Earl Many, PharmD []  Junita Push, PharmD []  Dorna Leitz, PharmD []  Terrilee Files, PharmD []  Lynann Beaver, PharmD []  Keturah Barre, PharmD []  Loralee Pacas, PharmD []  Bernadene Person, PharmD   Positive wound culture Treated with sulfamethoxazole-trimethoprim, organism sensitive to the same and no further patient follow-up is required at this time.  Berle Mull 12/31/2018, 10:32 AM

## 2020-01-10 ENCOUNTER — Ambulatory Visit: Payer: Self-pay

## 2020-01-30 ENCOUNTER — Ambulatory Visit: Payer: Self-pay | Attending: Internal Medicine

## 2020-01-30 DIAGNOSIS — Z23 Encounter for immunization: Secondary | ICD-10-CM

## 2020-01-30 NOTE — Progress Notes (Signed)
   Covid-19 Vaccination Clinic  Name:  Bryce Morris    MRN: [id number] DOB: 05/09/1986  01/30/2020  Mr. Bryce Morris was observed post Covid-19 immunization for 30 minutes based on pre-vaccination screening without incident. He was provided with Vaccine Information Sheet and instruction to access the V-Safe system.   Mr. Bryce Morris was instructed to call 911 with any severe reactions post vaccine: Marland Kitchen Difficulty breathing  . Swelling of face and throat  . A fast heartbeat  . A bad rash all over body  . Dizziness and weakness   Immunizations Administered    Name Date Dose VIS Date Route   Pfizer COVID-19 Vaccine 01/30/2020 10:33 AM 0.3 mL 10/16/2019 Intramuscular   Manufacturer: [employer], [employer]   Lot: [id number]   NDC: [address]-0

## 2020-02-23 ENCOUNTER — Ambulatory Visit: Payer: Self-pay | Attending: Internal Medicine

## 2020-02-23 DIAGNOSIS — Z23 Encounter for immunization: Secondary | ICD-10-CM

## 2020-02-23 NOTE — Progress Notes (Signed)
   Covid-19 Vaccination Clinic  Name:  AXLE PARFAIT    MRN: [id number] DOB: 02-07-1986  02/23/2020  Mr. Pinnock was observed post Covid-19 immunization for 30 minutes based on pre-vaccination screening without incident. He was provided with Vaccine Information Sheet and instruction to access the V-Safe system.   Mr. Chapdelaine was instructed to call 911 with any severe reactions post vaccine: Marland Kitchen Difficulty breathing  . Swelling of face and throat  . A fast heartbeat  . A bad rash all over body  . Dizziness and weakness   Immunizations Administered    Name Date Dose VIS Date Route   Pfizer COVID-19 Vaccine 02/23/2020 11:08 AM 0.3 mL 12/30/2018 Intramuscular   Manufacturer: [employer], [employer]   Lot: [id number]   NDC: [address]-5

## 2020-06-20 DIAGNOSIS — F339 Major depressive disorder, recurrent, unspecified: Secondary | ICD-10-CM | POA: Diagnosis not present

## 2020-07-18 DIAGNOSIS — F339 Major depressive disorder, recurrent, unspecified: Secondary | ICD-10-CM | POA: Diagnosis not present

## 2020-08-15 DIAGNOSIS — F339 Major depressive disorder, recurrent, unspecified: Secondary | ICD-10-CM | POA: Diagnosis not present

## 2020-09-27 DIAGNOSIS — F902 Attention-deficit hyperactivity disorder, combined type: Secondary | ICD-10-CM | POA: Diagnosis not present

## 2020-09-27 DIAGNOSIS — F339 Major depressive disorder, recurrent, unspecified: Secondary | ICD-10-CM | POA: Diagnosis not present

## 2020-09-27 DIAGNOSIS — F4312 Post-traumatic stress disorder, chronic: Secondary | ICD-10-CM | POA: Diagnosis not present

## 2020-10-04 DIAGNOSIS — R42 Dizziness and giddiness: Secondary | ICD-10-CM | POA: Diagnosis not present

## 2020-10-10 ENCOUNTER — Ambulatory Visit: Payer: BC Managed Care – PPO | Attending: Family Medicine | Admitting: Physical Therapy

## 2020-10-10 ENCOUNTER — Other Ambulatory Visit: Payer: Self-pay

## 2020-10-10 DIAGNOSIS — R42 Dizziness and giddiness: Secondary | ICD-10-CM | POA: Insufficient documentation
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
## 2020-11-08 DIAGNOSIS — F4312 Post-traumatic stress disorder, chronic: Secondary | ICD-10-CM | POA: Diagnosis not present

## 2020-11-08 DIAGNOSIS — F902 Attention-deficit hyperactivity disorder, combined type: Secondary | ICD-10-CM | POA: Diagnosis not present

## 2020-11-15 ENCOUNTER — Ambulatory Visit: Payer: BC Managed Care – PPO | Attending: Internal Medicine

## 2020-11-15 DIAGNOSIS — Z23 Encounter for immunization: Secondary | ICD-10-CM

## 2020-11-15 NOTE — Progress Notes (Signed)
   Covid-19 Vaccination Clinic  Name:  Bryce Morris    MRN: [id number] DOB: [birth year]  11/15/2020  [name] was observed post Covid-19 immunization for 15 minutes without incident. He was provided with Vaccine Information Sheet and instruction to access the V-Safe system.   [name] was instructed to call 911 with any severe reactions post vaccine: Marland Kitchen Difficulty breathing  . Swelling of face and throat  . A fast heartbeat  . A bad rash all over body  . Dizziness and weakness   Immunizations Administered    Name Date Dose VIS Date Route   Pfizer COVID-19 Vaccine 11/15/2020  5:31 PM 0.3 mL 08/24/2020 Intramuscular   Manufacturer: [employer], [employer]   Lot: [id number]   NDC: [address]-6

## 2020-12-19 DIAGNOSIS — F339 Major depressive disorder, recurrent, unspecified: Secondary | ICD-10-CM | POA: Diagnosis not present

## 2020-12-19 DIAGNOSIS — F902 Attention-deficit hyperactivity disorder, combined type: Secondary | ICD-10-CM | POA: Diagnosis not present

## 2020-12-19 DIAGNOSIS — F4312 Post-traumatic stress disorder, chronic: Secondary | ICD-10-CM | POA: Diagnosis not present

## 2021-01-11 DIAGNOSIS — S60012A Contusion of left thumb without damage to nail, initial encounter: Secondary | ICD-10-CM | POA: Diagnosis not present

## 2021-01-11 DIAGNOSIS — S60352A Superficial foreign body of left thumb, initial encounter: Secondary | ICD-10-CM | POA: Diagnosis not present

## 2021-01-16 DIAGNOSIS — F902 Attention-deficit hyperactivity disorder, combined type: Secondary | ICD-10-CM | POA: Diagnosis not present

## 2021-01-16 DIAGNOSIS — F4312 Post-traumatic stress disorder, chronic: Secondary | ICD-10-CM | POA: Diagnosis not present

## 2021-01-16 DIAGNOSIS — F339 Major depressive disorder, recurrent, unspecified: Secondary | ICD-10-CM | POA: Diagnosis not present

## 2021-02-13 DIAGNOSIS — F339 Major depressive disorder, recurrent, unspecified: Secondary | ICD-10-CM | POA: Diagnosis not present

## 2021-02-13 DIAGNOSIS — F4312 Post-traumatic stress disorder, chronic: Secondary | ICD-10-CM | POA: Diagnosis not present

## 2021-02-13 DIAGNOSIS — F902 Attention-deficit hyperactivity disorder, combined type: Secondary | ICD-10-CM | POA: Diagnosis not present

## 2021-03-27 DIAGNOSIS — F339 Major depressive disorder, recurrent, unspecified: Secondary | ICD-10-CM | POA: Diagnosis not present

## 2021-03-27 DIAGNOSIS — F902 Attention-deficit hyperactivity disorder, combined type: Secondary | ICD-10-CM | POA: Diagnosis not present

## 2021-03-27 DIAGNOSIS — F4312 Post-traumatic stress disorder, chronic: Secondary | ICD-10-CM | POA: Diagnosis not present

## 2021-06-01 DIAGNOSIS — B353 Tinea pedis: Secondary | ICD-10-CM | POA: Diagnosis not present

## 2021-07-03 DIAGNOSIS — Z125 Encounter for screening for malignant neoplasm of prostate: Secondary | ICD-10-CM | POA: Diagnosis not present

## 2021-07-03 DIAGNOSIS — E291 Testicular hypofunction: Secondary | ICD-10-CM | POA: Diagnosis not present

## 2021-08-08 DIAGNOSIS — R5383 Other fatigue: Secondary | ICD-10-CM | POA: Diagnosis not present

## 2021-08-08 DIAGNOSIS — Z6839 Body mass index (BMI) 39.0-39.9, adult: Secondary | ICD-10-CM | POA: Diagnosis not present

## 2021-08-08 DIAGNOSIS — E291 Testicular hypofunction: Secondary | ICD-10-CM | POA: Diagnosis not present

## 2021-08-08 DIAGNOSIS — R6882 Decreased libido: Secondary | ICD-10-CM | POA: Diagnosis not present

## 2021-08-16 DIAGNOSIS — R5383 Other fatigue: Secondary | ICD-10-CM | POA: Diagnosis not present

## 2021-08-16 DIAGNOSIS — E559 Vitamin D deficiency, unspecified: Secondary | ICD-10-CM | POA: Diagnosis not present

## 2021-08-16 DIAGNOSIS — R635 Abnormal weight gain: Secondary | ICD-10-CM | POA: Diagnosis not present

## 2021-08-16 DIAGNOSIS — E291 Testicular hypofunction: Secondary | ICD-10-CM | POA: Diagnosis not present

## 2021-08-24 DIAGNOSIS — Z1331 Encounter for screening for depression: Secondary | ICD-10-CM | POA: Diagnosis not present

## 2021-08-24 DIAGNOSIS — Z1339 Encounter for screening examination for other mental health and behavioral disorders: Secondary | ICD-10-CM | POA: Diagnosis not present

## 2021-08-24 DIAGNOSIS — E291 Testicular hypofunction: Secondary | ICD-10-CM | POA: Diagnosis not present

## 2021-08-24 DIAGNOSIS — Z6839 Body mass index (BMI) 39.0-39.9, adult: Secondary | ICD-10-CM | POA: Diagnosis not present

## 2021-08-24 DIAGNOSIS — R635 Abnormal weight gain: Secondary | ICD-10-CM | POA: Diagnosis not present

## 2021-08-31 DIAGNOSIS — R7309 Other abnormal glucose: Secondary | ICD-10-CM | POA: Diagnosis not present

## 2021-08-31 DIAGNOSIS — Z6839 Body mass index (BMI) 39.0-39.9, adult: Secondary | ICD-10-CM | POA: Diagnosis not present

## 2021-09-08 DIAGNOSIS — E559 Vitamin D deficiency, unspecified: Secondary | ICD-10-CM | POA: Diagnosis not present

## 2021-09-08 DIAGNOSIS — Z6838 Body mass index (BMI) 38.0-38.9, adult: Secondary | ICD-10-CM | POA: Diagnosis not present

## 2021-09-11 DIAGNOSIS — Z0000 Encounter for general adult medical examination without abnormal findings: Secondary | ICD-10-CM | POA: Diagnosis not present

## 2021-09-15 DIAGNOSIS — Z6838 Body mass index (BMI) 38.0-38.9, adult: Secondary | ICD-10-CM | POA: Diagnosis not present

## 2021-09-15 DIAGNOSIS — E291 Testicular hypofunction: Secondary | ICD-10-CM | POA: Diagnosis not present

## 2021-09-15 DIAGNOSIS — E559 Vitamin D deficiency, unspecified: Secondary | ICD-10-CM | POA: Diagnosis not present

## 2021-09-21 DIAGNOSIS — Z6838 Body mass index (BMI) 38.0-38.9, adult: Secondary | ICD-10-CM | POA: Diagnosis not present

## 2021-09-21 DIAGNOSIS — R7309 Other abnormal glucose: Secondary | ICD-10-CM | POA: Diagnosis not present

## 2023-01-14 DIAGNOSIS — F909 Attention-deficit hyperactivity disorder, unspecified type: Secondary | ICD-10-CM | POA: Insufficient documentation

## 2024-03-05 ENCOUNTER — Other Ambulatory Visit: Payer: Self-pay

## 2024-03-05 ENCOUNTER — Encounter: Payer: Self-pay | Admitting: Neurology

## 2024-03-05 DIAGNOSIS — R202 Paresthesia of skin: Secondary | ICD-10-CM

## 2024-04-16 ENCOUNTER — Ambulatory Visit (INDEPENDENT_AMBULATORY_CARE_PROVIDER_SITE_OTHER): Payer: PRIVATE HEALTH INSURANCE | Admitting: Neurology

## 2024-04-16 DIAGNOSIS — R202 Paresthesia of skin: Secondary | ICD-10-CM | POA: Diagnosis not present

## 2024-04-16 DIAGNOSIS — G5601 Carpal tunnel syndrome, right upper limb: Secondary | ICD-10-CM

## 2024-04-16 NOTE — Procedures (Signed)
  [facility]  [address] [place], [address]  [place] [facility] [place], [place] [address] Tel: [phone] Fax: [phone] Test Date:  04/16/2024  Patient: Bryce Morris DOB: May 30, 1986 Physician: Reyna Cava, DO  Sex: Male Height: 5' 10 Ref Phys: Rance Burrows, MD  ID#: [id number]   Technician:    History: This is a 38 year old man referred for evaluation of right upper extremity pain and paresthesias.  NCV & EMG Findings: Extensive electrodiagnostic testing of the right upper extremity shows:  Right median sensory response shows prolonged latency (3.7 ms) and reduced amplitude (13.0 V).  Right ulnar sensory response is within normal limits.   Right median and ulnar motor responses are within normal limits.   There is no evidence of active or chronic motor axonal loss changes affecting any of the tested muscles.  Motor unit configuration and recruitment pattern is within normal limits.    Impression: Right median neuropathy at or distal to the wrist, consistent with a clinical diagnosis of carpal tunnel syndrome.  Overall, these findings are moderate in degree electrically. There is no evidence of a right ulnar neuropathy or cervical radiculopathy.   ___________________________ Reyna Cava, DO    Nerve Conduction Studies   Stim Site NR Peak (ms) Norm Peak (ms) O-P Amp (V) Norm O-P Amp  Right Median Anti Sensory (2nd Digit)  32 C  Wrist    *3.7 <3.4 *13.0 >20  Right Ulnar Anti Sensory (5th Digit)  32 C  Wrist    2.5 <3.1 20.6 >12     Stim Site NR Onset (ms) Norm Onset (ms) O-P Amp (mV) Norm O-P Amp Site1 Site2 Delta-0 (ms) Dist (cm) Vel (m/s) Norm Vel (m/s)  Right Median Motor (Abd Poll Brev)  32 C  Wrist    3.0 <3.9 9.2 >6 Elbow Wrist 5.0 34.0 68 >50  Elbow    8.0  8.7         Right Ulnar Motor (Abd Dig Minimi)  32 C  Wrist    2.3 <3.1 11.1 >7 B Elbow Wrist 3.6 24.0 67 >50  B Elbow    5.9  10.4  A Elbow B Elbow 1.3 10.0 77 >50  A Elbow    7.2  10.4           Electromyography   Side Muscle Ins.Act Fibs Fasc Recrt Amp Dur Poly Activation Comment  Right 1stDorInt Nml Nml Nml Nml Nml Nml Nml Nml N/A  Right Abd Poll Brev Nml Nml Nml Nml Nml Nml Nml Nml N/A  Right PronatorTeres Nml Nml Nml Nml Nml Nml Nml Nml N/A  Right Biceps Nml Nml Nml Nml Nml Nml Nml Nml N/A  Right Triceps Nml Nml Nml Nml Nml Nml Nml Nml N/A  Right Deltoid Nml Nml Nml Nml Nml Nml Nml Nml N/A      Waveforms:

## 2024-07-01 ENCOUNTER — Encounter: Payer: Self-pay | Admitting: Dermatology

## 2024-07-01 ENCOUNTER — Ambulatory Visit: Payer: PRIVATE HEALTH INSURANCE | Admitting: Dermatology

## 2024-07-01 VITALS — BP 135/91 | HR 69

## 2024-07-01 DIAGNOSIS — M546 Pain in thoracic spine: Secondary | ICD-10-CM | POA: Insufficient documentation

## 2024-07-01 DIAGNOSIS — B353 Tinea pedis: Secondary | ICD-10-CM | POA: Insufficient documentation

## 2024-07-01 DIAGNOSIS — L570 Actinic keratosis: Secondary | ICD-10-CM

## 2024-07-01 DIAGNOSIS — K219 Gastro-esophageal reflux disease without esophagitis: Secondary | ICD-10-CM | POA: Insufficient documentation

## 2024-07-01 DIAGNOSIS — L918 Other hypertrophic disorders of the skin: Secondary | ICD-10-CM

## 2024-07-01 DIAGNOSIS — E66812 Obesity, class 2: Secondary | ICD-10-CM | POA: Insufficient documentation

## 2024-07-01 DIAGNOSIS — B1920 Unspecified viral hepatitis C without hepatic coma: Secondary | ICD-10-CM | POA: Insufficient documentation

## 2024-07-01 DIAGNOSIS — W908XXA Exposure to other nonionizing radiation, initial encounter: Secondary | ICD-10-CM

## 2024-07-01 DIAGNOSIS — G5600 Carpal tunnel syndrome, unspecified upper limb: Secondary | ICD-10-CM | POA: Insufficient documentation

## 2024-07-01 NOTE — Progress Notes (Addendum)
 New Patient Visit   Subjective  Bryce Morris is a 38 y.o. male who presents for the following: Spots check  Patient states he  has spots located at the left cheek and Right ala that he  would like to have examined. Patient reports the areas have been there for several years. He reports the areas are bothersome. Patient reports the areas can be itchy. He also stated they started as pimples but never healed. Patient rates irritation 3 out of 10. Patient reports he  has not previously been treated for these areas. Patient denies Hx of bx. Patient reports family history of skin cancer(s) (Dad unsure of what type).  The patient has spots, moles and lesions to be evaluated, some may be new or changing and the patient may have concern these could be cancer.  The following portions of the chart were reviewed this encounter and updated as appropriate: medications, allergies, medical history  Review of Systems:  No other skin or systemic complaints except as noted in HPI or Assessment and Plan.  Objective  Well appearing patient in no apparent distress; mood and affect are within normal limits.  A focused examination was performed of the following areas: Left Cheek and Right ala  Relevant exam findings are noted in the Assessment and Plan.       Left Buccal Cheek, Right Ala Nasi Pink gritty papules  Assessment & Plan   1. Actinic Keratoses - Assessment: Patient presents with two actinic keratoses on the face, with one that occasionally bleeds when manipulated. 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 Fair skin and red hair increase susceptibility to sun damage. Rough textured lesions indicative of dysplastic cellular changes. Risk for progression to squamous cell carcinoma if left untreated. - Plan:    Perform cryotherapy with liquid nitrogen on both identified actinic keratoses    Instruct patient to apply Aquaphor healing ointment to treated  areas every morning and night    Discuss field therapy (topical chemotherapy cream) at follow-up appointment    Emphasize importance of sun avoidance during treatment    Plan to initiate treatment in winter months    Educate patient on sun protection measures including consistent sunscreen use and protective clothing  2. Skin Tag - Assessment: Benign skin tag identified upon examination. Often associated with weight gain and insulin  resistance. Can be early indicator of metabolic changes. - Plan:    Reassure patient that skin tag is benign    Educate patient on association between skin tags, weight gain, and insulin  resistance    Encourage lifestyle modifications including monitoring sugar intake and continued exercise    Re-evaluate skin tag at follow-up appointment  Follow-up in January for full skin check and evaluation of treated areas.  AK (ACTINIC KERATOSIS) (2) Left Buccal Cheek, Right Ala Nasi Destruction of lesion - Left Buccal Cheek, Right Ala Nasi Complexity: simple   Destruction method: cryotherapy   Informed consent: discussed and consent obtained   Timeout:  patient name, date of birth, surgical site, and procedure verified Lesion destroyed using liquid nitrogen: Yes   Region frozen until ice ball extended beyond lesion: Yes   Outcome: patient tolerated procedure well with no complications   Post-procedure details: wound care instructions given    SKIN TAG    Return in about 6 months (around 01/01/2025) for TBSE.  I, Jetta Ager, am acting as Neurosurgeon for [employer], DO.  Documentation: I have reviewed the above documentation for accuracy  and completeness, and I agree with the above.  Delon Lenis, DO

## 2024-07-01 NOTE — Patient Instructions (Addendum)

## 2024-12-08 ENCOUNTER — Ambulatory Visit: Payer: PRIVATE HEALTH INSURANCE | Admitting: Dermatology
